# Patient Record
Sex: Male | Born: 1955 | Race: White | Hispanic: No | Marital: Married | Smoking: Former smoker
Health system: Southern US, Community
[De-identification: ages and names within clinical notes are randomized; demographics above are authoritative.]

## PROBLEM LIST (undated history)

## (undated) DIAGNOSIS — G4730 Sleep apnea, unspecified: Secondary | ICD-10-CM

## (undated) DIAGNOSIS — Z87442 Personal history of urinary calculi: Secondary | ICD-10-CM

## (undated) DIAGNOSIS — R7303 Prediabetes: Secondary | ICD-10-CM

## (undated) DIAGNOSIS — I10 Essential (primary) hypertension: Secondary | ICD-10-CM

## (undated) DIAGNOSIS — M1990 Unspecified osteoarthritis, unspecified site: Secondary | ICD-10-CM

## (undated) HISTORY — PX: ELBOW SURGERY: SHX618

## (undated) HISTORY — PX: MENISCUS REPAIR: SHX5179

## (undated) HISTORY — PX: ROTATOR CUFF REPAIR: SHX139

## (undated) HISTORY — PX: NASAL SEPTUM SURGERY: SHX37

---

## 2007-08-24 ENCOUNTER — Encounter: Admission: RE | Admit: 2007-08-24 | Discharge: 2007-08-24 | Payer: Self-pay | Admitting: Geriatric Medicine

## 2007-08-25 ENCOUNTER — Encounter: Admission: RE | Admit: 2007-08-25 | Discharge: 2007-08-25 | Payer: Self-pay | Admitting: Geriatric Medicine

## 2007-08-30 ENCOUNTER — Encounter: Admission: RE | Admit: 2007-08-30 | Discharge: 2007-08-30 | Payer: Self-pay | Admitting: Geriatric Medicine

## 2008-09-21 IMAGING — CR DG ORBITS FOR FOREIGN BODY
3 series · 3 of 3 positions shown · non-contrast
Comparison: None

CLINICAL DATA: Maalox pressure, MRI clearance

ORBITS FOR FOREIGN BODY - 2 VIEW

[view not recorded (1 of 3)]
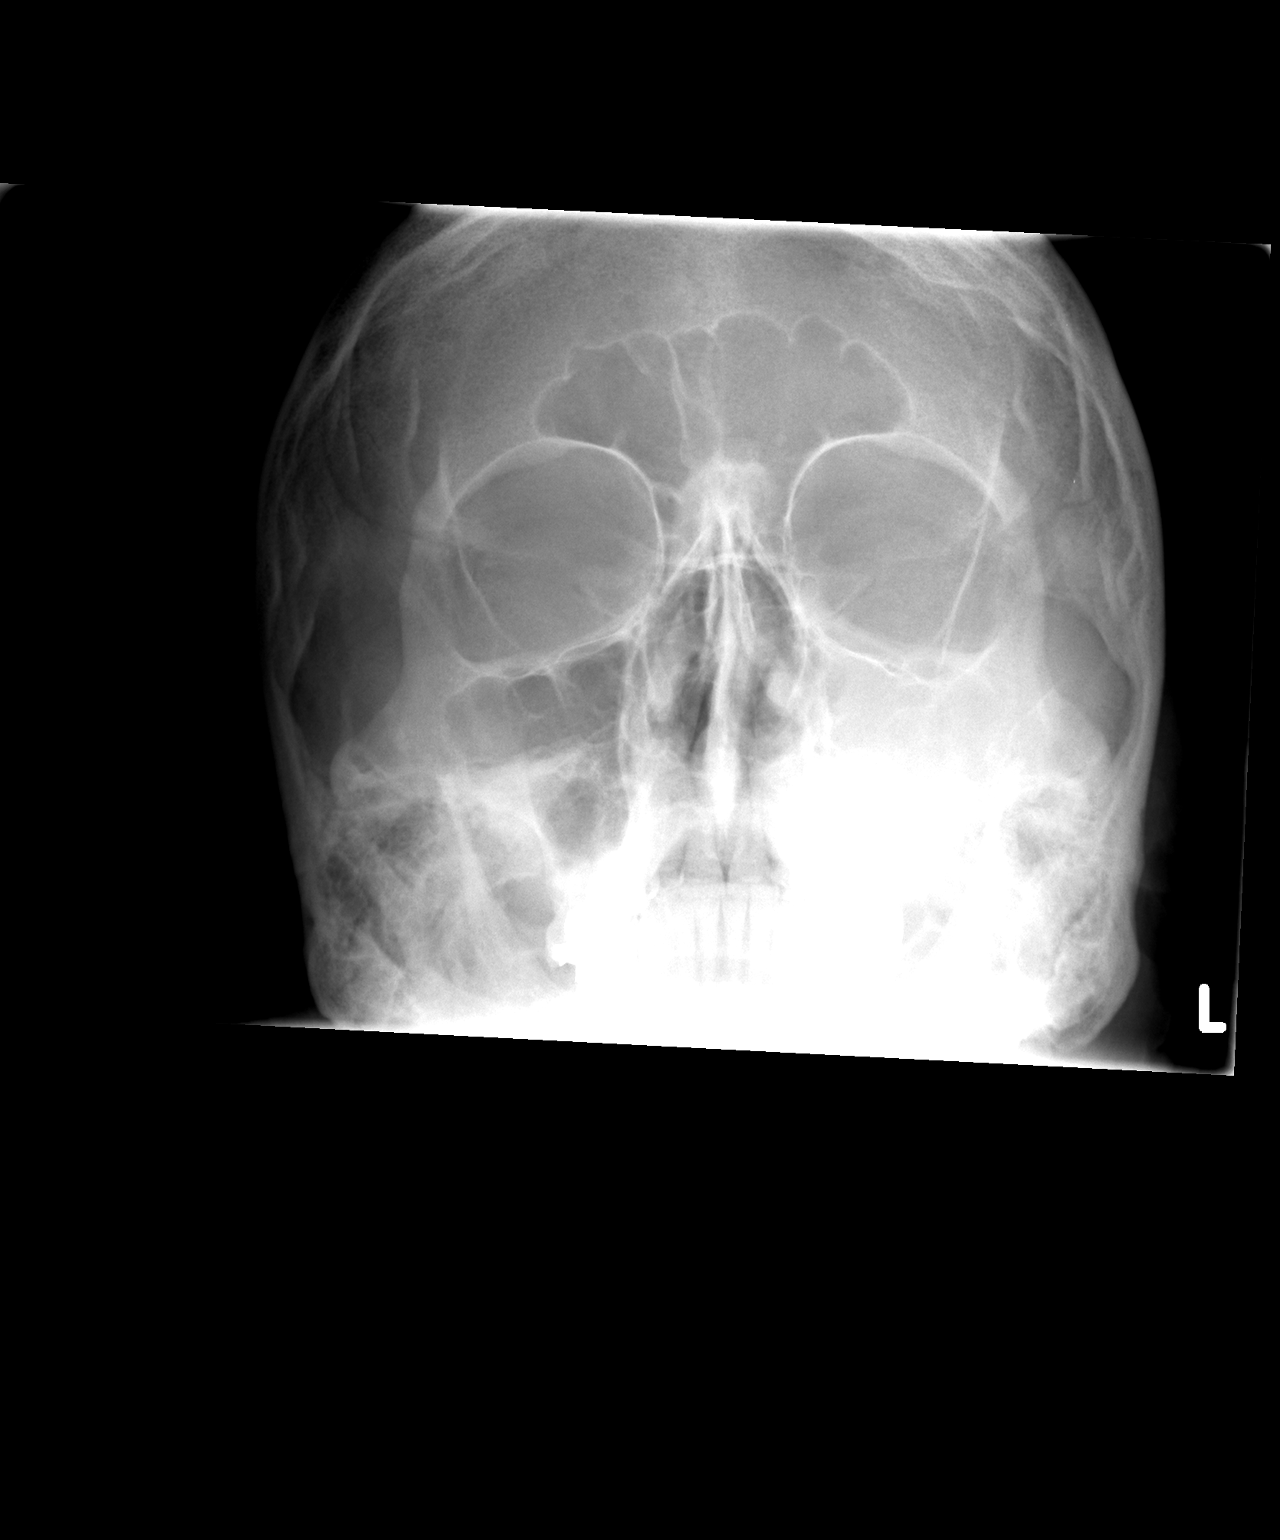

[view not recorded (2 of 3)]
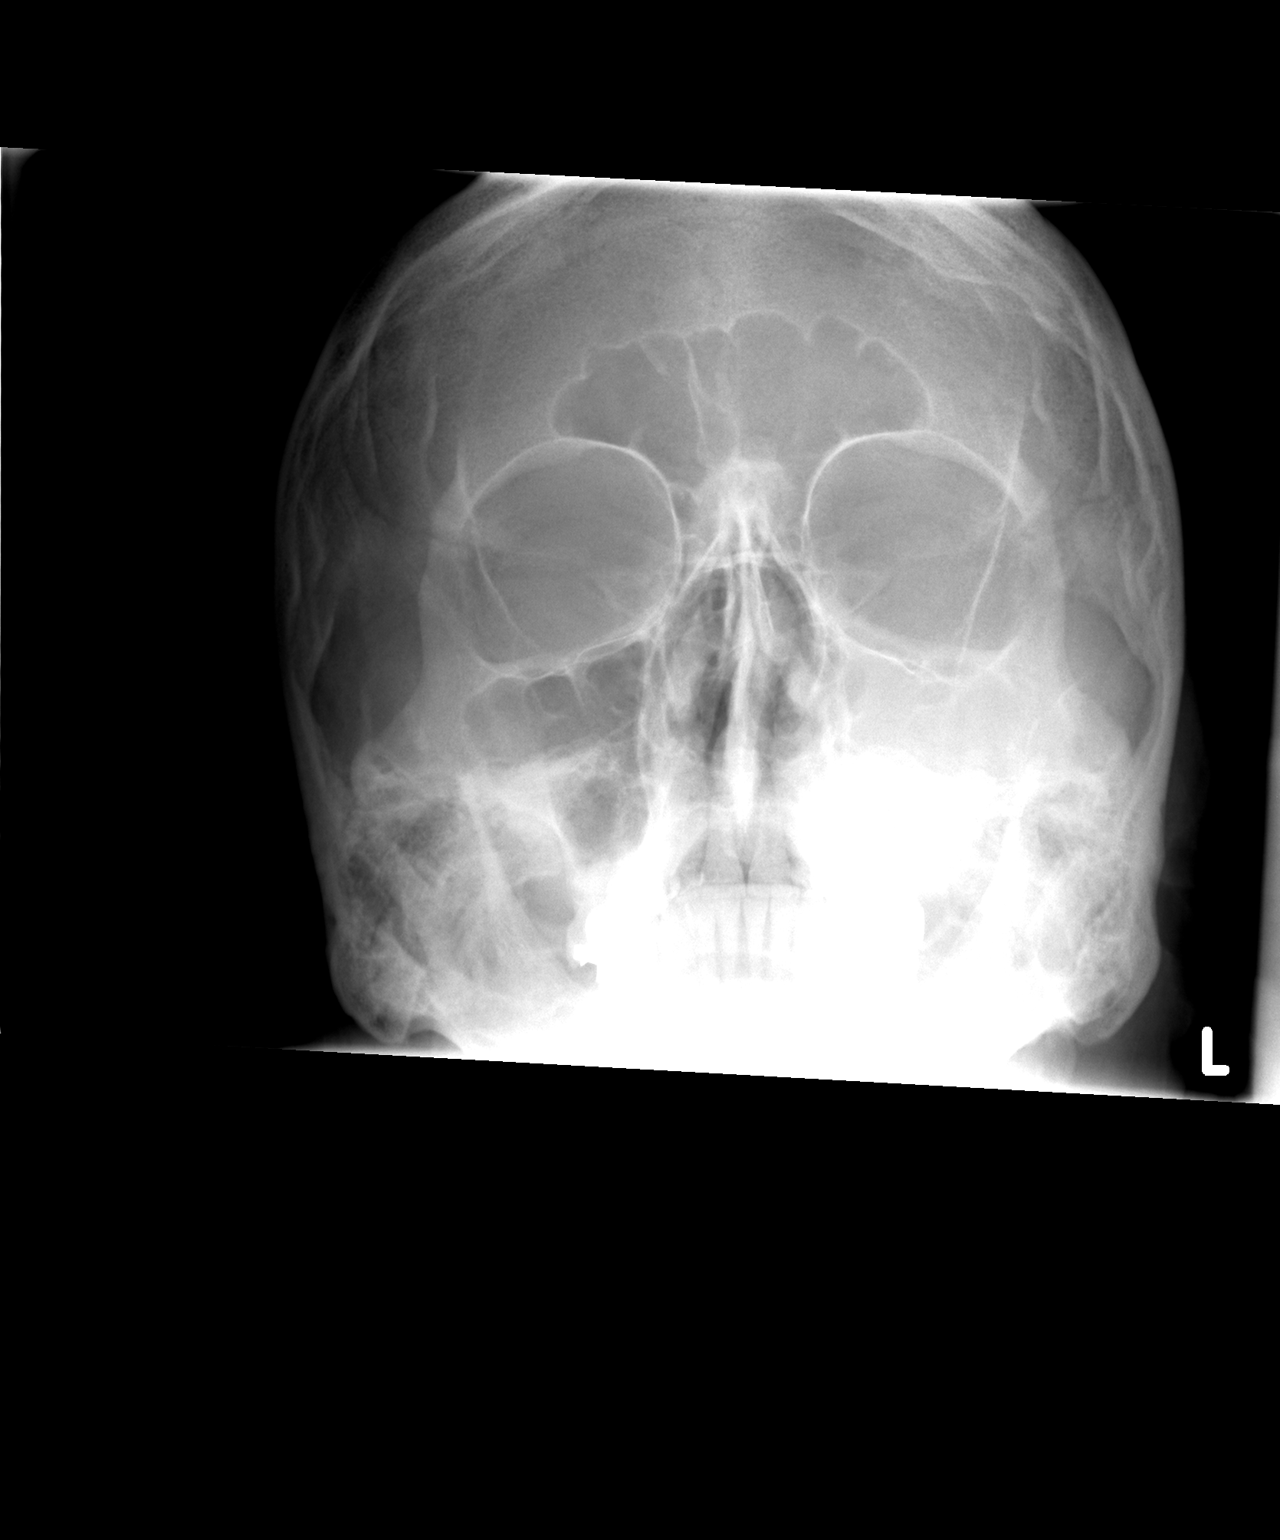

[view not recorded (3 of 3)]
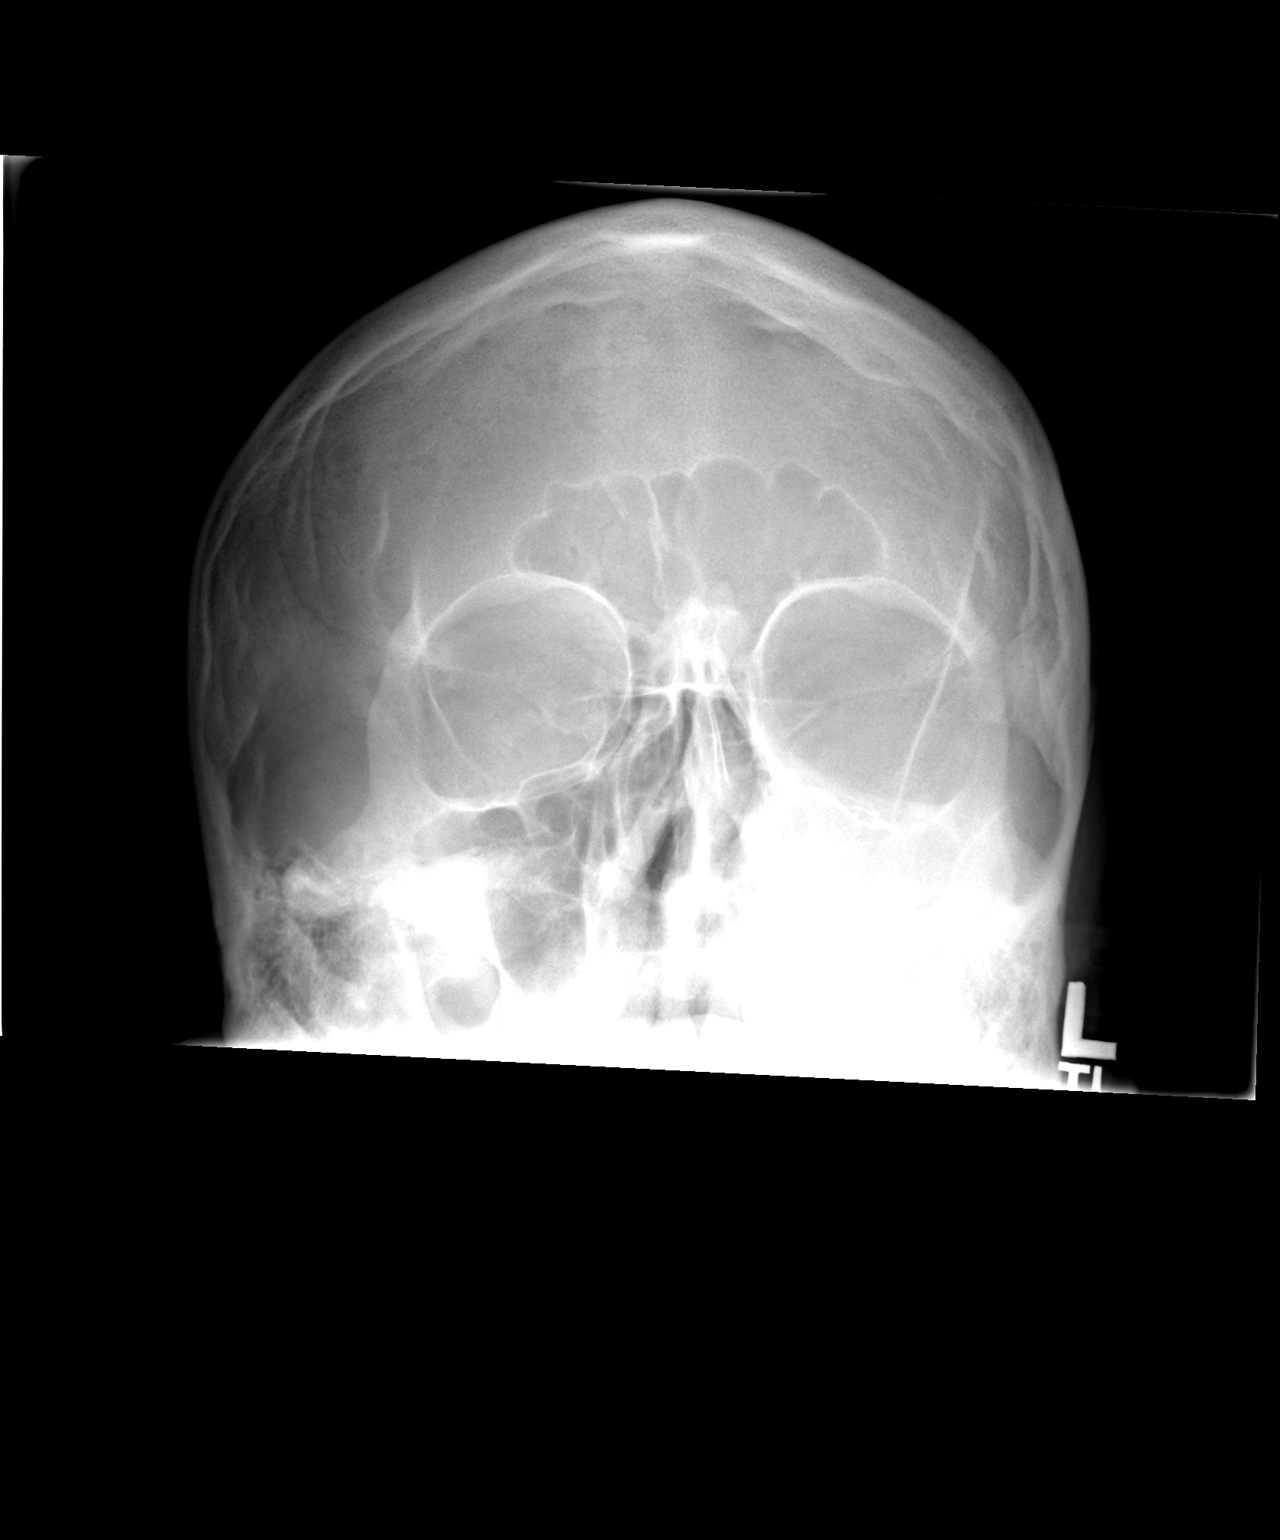

[3 of 3 positions shown; findings below may reference images not displayed]

FINDINGS: No radiodense foreign bodies within the orbits.
IMPRESSION: 1.  No radiodense foreign bodies.  The patient is cleared for MRI.

## 2009-07-12 ENCOUNTER — Ambulatory Visit (HOSPITAL_BASED_OUTPATIENT_CLINIC_OR_DEPARTMENT_OTHER): Admission: RE | Admit: 2009-07-12 | Discharge: 2009-07-12 | Payer: Self-pay | Admitting: Orthopedic Surgery

## 2017-12-18 ENCOUNTER — Ambulatory Visit (INDEPENDENT_AMBULATORY_CARE_PROVIDER_SITE_OTHER): Payer: Self-pay | Admitting: Orthopaedic Surgery

## 2017-12-24 ENCOUNTER — Ambulatory Visit (INDEPENDENT_AMBULATORY_CARE_PROVIDER_SITE_OTHER): Payer: Self-pay | Admitting: Orthopaedic Surgery

## 2017-12-29 ENCOUNTER — Ambulatory Visit (INDEPENDENT_AMBULATORY_CARE_PROVIDER_SITE_OTHER): Payer: No Typology Code available for payment source

## 2017-12-29 ENCOUNTER — Ambulatory Visit (INDEPENDENT_AMBULATORY_CARE_PROVIDER_SITE_OTHER): Payer: No Typology Code available for payment source | Admitting: Orthopaedic Surgery

## 2017-12-29 ENCOUNTER — Encounter (INDEPENDENT_AMBULATORY_CARE_PROVIDER_SITE_OTHER): Payer: Self-pay | Admitting: Orthopaedic Surgery

## 2017-12-29 DIAGNOSIS — G8929 Other chronic pain: Secondary | ICD-10-CM

## 2017-12-29 DIAGNOSIS — M25561 Pain in right knee: Secondary | ICD-10-CM

## 2017-12-29 DIAGNOSIS — M1711 Unilateral primary osteoarthritis, right knee: Secondary | ICD-10-CM | POA: Diagnosis not present

## 2017-12-29 NOTE — Progress Notes (Signed)
Office Visit Note   Patient: Jeffrey Brady           Date of Birth: 1956-01-06           MRN: [id number] Visit Date: 12/29/2017              Requested by: No referring provider defined for this encounter. PCP: Crist Fat, MD   Assessment & Plan: Visit Diagnoses:  1. Chronic pain of right knee   2. Unilateral primary osteoarthritis, right knee     Plan: Given the extent of the cartilage loss in the medial compartment of his knee I have explained to him that an arthroscopic intervention would only temporize things and likely have only about a 30 to 40% chance of working at all.  He has a wide area of large full-thickness cartilage loss on the medial compartment knee with subchondral reactive edema.  At this point I would really recommend a knee replacement based on the severity of his arthritis and the pain that he is having.  I showed him a knee model explained in detail what the surgery involves.  We had a long thorough discussion about his intraoperative and postoperative course and the risk and benefits of this as well.  He is going to talk to our surgery scheduler today because he and his wife feel this is in his best interest as to why based on how this is detrimentally affecting his activities daily living, his quality life, his mobility.  Is been getting worse for over 7 months now in spite of all conservative treatment efforts being tried and failed include even quad strengthening exercises.  Follow-Up Instructions: Return for 2 weeks post-op.   Orders:  Orders Placed This Encounter  Procedures  . XR Knee 1-2 Views Right   No orders of the defined types were placed in this encounter.     Procedures: No procedures performed   Clinical Data: No additional findings.   Subjective: Chief Complaint  Patient presents with  . Right Knee - Pain  Patient is here today for evaluation treatment of right knee pain.  He is actually seen another orthopedic surgeon in his  hometown in [place].  He understands that he has significant cartilage loss in the medial aspect of his knee with a small meniscal tear he also understands that there is a low likelihood that arthroscopic surgery could help this.  His pain is become 10 out of 10 and is daily.  It hurts mainly in the medial aspect of his right knee but some laterally.  He feels like the knee is unstable to him as well.  He has an MRI that accompanies him of his right knee.  He is tried a steroid injection in his knee and is work on activity modification.  He takes anti-inflammatories.  The pain is severe at this point it wakes him up at night and it is detrimentally affect is active daily living, his mobility, and his quality of life.  HPI  Review of Systems He denies any headache, chest pain, shortness of breath, fever, chills, nausea, vomiting.  Objective: Vital Signs: There were no vitals taken for this visit.  Physical Exam He is alert and oriented x3 and in no acute distress Ortho Exam Examination his right knee shows slight varus malalignment.  There is significant medial lateral joint line tenderness with patellofemoral crepitation and painful range of motion.  There is a mild to moderate effusion. Specialty Comments:  No specialty  comments available.  Imaging: No results found. The MRI of his right knee is reviewed.  He has significant medial joint space narrowing.  There is a wide area of full-thickness cartilage loss on the medial femoral condyle and the medial tibial plateau with reactive subchondral edema.  There is mild to moderate thinning laterally.  There is a small posterior horn meniscal root tear which is very small and is radial.  PMFS History: Patient Active Problem List   Diagnosis Date Noted  . Unilateral primary osteoarthritis, right knee 12/29/2017   History reviewed. No pertinent past medical history.  History reviewed. No pertinent family history.  History  reviewed. No pertinent surgical history. Social History   Occupational History  . Not on file  Tobacco Use  . Smoking status: Not on file  Substance and Sexual Activity  . Alcohol use: Not on file  . Drug use: Not on file  . Sexual activity: Not on file

## 2018-01-06 ENCOUNTER — Other Ambulatory Visit (INDEPENDENT_AMBULATORY_CARE_PROVIDER_SITE_OTHER): Payer: Self-pay

## 2018-01-06 ENCOUNTER — Telehealth (INDEPENDENT_AMBULATORY_CARE_PROVIDER_SITE_OTHER): Payer: Self-pay | Admitting: Orthopaedic Surgery

## 2018-01-06 MED ORDER — ACETAMINOPHEN-CODEINE #3 300-30 MG PO TABS: 1.0000 | ORAL_TABLET | Freq: Three times a day (TID) | ORAL | 0 refills | Status: DC | PRN

## 2018-01-06 NOTE — Telephone Encounter (Signed)
Please advise 

## 2018-01-06 NOTE — Telephone Encounter (Signed)
Called into pharmacy, patient aware 

## 2018-01-06 NOTE — Telephone Encounter (Signed)
Preoperatively we can only put her on tramadol or Tylenol 3.  Find out which she would like and then call in 1-2 every 8 hours as needed for pain #40 with no refills.  Also we do not have the ability to move her surgery up from what I understand due to a busy surgery schedule.

## 2018-01-06 NOTE — Telephone Encounter (Signed)
Patients wife Olegario Messier states patient sch'd for surgery 01/20/18, Olegario Messier also states patient's pain has increased and wanted to know if patient could have something for pain. Patient wanted to see if surgery could be moved up also. Kathy's call back # [phone]

## 2018-01-09 ENCOUNTER — Other Ambulatory Visit (INDEPENDENT_AMBULATORY_CARE_PROVIDER_SITE_OTHER): Payer: Self-pay | Admitting: Physician Assistant

## 2018-01-12 NOTE — Pre-Procedure Instructions (Signed)
Jeffrey Brady  01/12/2018      [id number] - [place], [place] - [address] [facility] [place] [address] Phone: [phone] Fax: [phone]    Your procedure is scheduled on 01/20/2018.  Report to [facility] Admitting at 1:20 P.M.  Call this number if you have problems the morning of surgery:  [phone]   Remember:  Do not eat or drink after midnight.     Take these medicines the morning of surgery with A SIP OF WATER: Acetaminophen (Tylenol) - if needed Hydrocodone-acetaminophen (Norco) - if needed  7 days prior to surgery STOP taking any Aspirin (unless otherwise instructed by your surgeon), Aleve, Naproxen, Ibuprofen, Motrin, Advil, Goody's, BC's, all herbal medications, fish oil, and all vitamins     Do not wear jewelry.  Do not wear lotions, powders, or colognes, or deodorant.  Men may shave face and neck.  Do not bring valuables to the hospital.  [facility] is not responsible for any belongings or valuables.  Contacts, eyeglasses, hearing aids, dentures or bridgework may not be worn into surgery.  Leave your suitcase in the car.  After surgery it may be brought to your room.  For patients admitted to the hospital, discharge time will be determined by your treatment team.  Patients discharged the day of surgery will not be allowed to drive home.   Name and phone number of your driver:    Special instructions:   [facility]- Preparing For Surgery  Before surgery, you can play an important role. Because skin is not sterile, your skin needs to be as free of germs as possible. You can reduce the number of germs on your skin by washing with CHG (chlorahexidine gluconate) Soap before surgery.  CHG is an antiseptic cleaner which kills germs and bonds with the skin to continue killing germs even after washing.    Oral Hygiene is also important to reduce your risk of infection.  Remember - BRUSH YOUR TEETH THE MORNING OF SURGERY  WITH YOUR REGULAR TOOTHPASTE  Please do not use if you have an allergy to CHG or antibacterial soaps. If your skin becomes reddened/irritated stop using the CHG.  Do not shave (including legs and underarms) for at least 48 hours prior to first CHG shower. It is OK to shave your face.  Please follow these instructions carefully.   1. Shower the NIGHT BEFORE SURGERY and the MORNING OF SURGERY with CHG.   2. If you chose to wash your hair, wash your hair first as usual with your normal shampoo.  3. After you shampoo, rinse your hair and body thoroughly to remove the shampoo.  4. Use CHG as you would any other liquid soap. You can apply CHG directly to the skin and wash gently with a scrungie or a clean washcloth.   5. Apply the CHG Soap to your body ONLY FROM THE NECK DOWN.  Do not use on open wounds or open sores. Avoid contact with your eyes, ears, mouth and genitals (private parts). Wash Face and genitals (private parts)  with your normal soap.  6. Wash thoroughly, paying special attention to the area where your surgery will be performed.  7. Thoroughly rinse your body with warm water from the neck down.  8. DO NOT shower/wash with your normal soap after using and rinsing off the CHG Soap.  9. Pat yourself dry with a CLEAN TOWEL.  10. Wear CLEAN PAJAMAS to bed the night  before surgery, wear comfortable clothes the morning of surgery  11. Place CLEAN SHEETS on your bed the night of your first shower and DO NOT SLEEP WITH PETS.    Day of Surgery: Shower as stated above. Do not apply any deodorants/lotions.  Please wear clean clothes to the hospital/surgery center.   Remember to brush your teeth WITH YOUR REGULAR TOOTHPASTE.    Please read over the following fact sheets that you were given.

## 2018-01-13 ENCOUNTER — Encounter (HOSPITAL_COMMUNITY): Payer: Self-pay

## 2018-01-13 ENCOUNTER — Encounter (HOSPITAL_COMMUNITY)
Admission: RE | Admit: 2018-01-13 | Discharge: 2018-01-13 | Disposition: A | Discharge status: Home / Self Care | Payer: No Typology Code available for payment source | Source: Ambulatory Visit | Attending: Orthopaedic Surgery | Admitting: Orthopaedic Surgery

## 2018-01-13 ENCOUNTER — Other Ambulatory Visit: Payer: Self-pay

## 2018-01-13 DIAGNOSIS — I491 Atrial premature depolarization: Secondary | ICD-10-CM | POA: Diagnosis not present

## 2018-01-13 DIAGNOSIS — I4510 Unspecified right bundle-branch block: Secondary | ICD-10-CM | POA: Diagnosis not present

## 2018-01-13 DIAGNOSIS — Z01818 Encounter for other preprocedural examination: Secondary | ICD-10-CM | POA: Diagnosis present

## 2018-01-13 DIAGNOSIS — M1711 Unilateral primary osteoarthritis, right knee: Secondary | ICD-10-CM | POA: Insufficient documentation

## 2018-01-13 DIAGNOSIS — I119 Hypertensive heart disease without heart failure: Secondary | ICD-10-CM | POA: Diagnosis not present

## 2018-01-13 HISTORY — DX: Essential (primary) hypertension: I10

## 2018-01-13 HISTORY — DX: Sleep apnea, unspecified: G47.30

## 2018-01-13 LAB — CBC
HEMATOCRIT: 49.3 % (ref 39.0–52.0)
Hemoglobin: 16.5 g/dL (ref 13.0–17.0)
MCH: 30.1 pg (ref 26.0–34.0)
MCHC: 33.5 g/dL (ref 30.0–36.0)
MCV: 90 fL (ref 80.0–100.0)
NRBC: 0 % (ref 0.0–0.2)
PLATELETS: 193 10*3/uL (ref 150–400)
RBC: 5.48 MIL/uL (ref 4.22–5.81)
RDW: 12.7 % (ref 11.5–15.5)
WBC: 7.7 10*3/uL (ref 4.0–10.5)

## 2018-01-13 LAB — SURGICAL PCR SCREEN
MRSA, PCR: NEGATIVE
Staphylococcus aureus: NEGATIVE

## 2018-01-13 LAB — BASIC METABOLIC PANEL
ANION GAP: 13 (ref 5–15)
BUN: 13 mg/dL (ref 8–23)
CO2: 23 mmol/L (ref 22–32)
Calcium: 10.1 mg/dL (ref 8.9–10.3)
Chloride: 103 mmol/L (ref 98–111)
Creatinine, Ser: 0.78 mg/dL (ref 0.61–1.24)
GFR calc non Af Amer: >60 mL/min (ref >60)
Glucose, Bld: 94 mg/dL (ref 70–99)
Potassium: 4.4 mmol/L (ref 3.5–5.1)
Sodium: 139 mmol/L (ref 135–145)

## 2018-01-13 NOTE — Progress Notes (Signed)
PCP - Dr. Eliezer Champagne Cardiologist - patient denies  Chest x-ray - n/a EKG - 01/13/2018 Stress Test - patient denies ECHO - patient denies Cardiac Cath - patient denies  Sleep Study - patient states it was done in [place] over 30 years ago CPAP - yes, unsure of settings  Anesthesia review: yes, abnormal EKG  Patient denies shortness of breath, fever, cough and chest pain at PAT appointment   Patient verbalized understanding of instructions that were given to them at the PAT appointment. Patient was also instructed that they will need to review over the PAT instructions again at home before surgery.

## 2018-01-13 NOTE — Progress Notes (Addendum)
Anesthesia Chart Review:  Case:  [id number] Date/Time:  01/20/18 1504   Procedure:  RIGHT TOTAL KNEE ARTHROPLASTY (Right Knee)   Anesthesia type:  Choice   Pre-op diagnosis:  osteoarthritis right knee   Location:  [facility] ROOM 07 / [facility] OR   Surgeon:  Kathryne Hitch, MD      DISCUSSION: Patient is a 62 year old male scheduled for the above procedure. History includes former smoker, HTN, OSA. BMI is consistent with obesity.  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 He denied chest pain, SOB, cough, fever at PAT. He denied having a cardiologist or previous stress, echo, or cardiac cath. He inquired about NPO status given surgery is not until after 3:00 PM. Reviewed with anesthesiologist Achille Rich, MD. If okay with Dr. Magnus Ivan could consider allowing patient to have clear liquids or light/non-fatty breakfast up to 7:00 AM on the morning of surgery. If there is a chance that case could move up earlier then Dr. Magnus Ivan may still want to keep patient NPO after midnight. I've left a voice message with Sherrie to clarify if Dr. Magnus Ivan has a preference so I can follow-up with patient. EKG also reviewed with Dr. Chaney Malling, and if without CV symptoms then would anticipate that he can proceed as planned. I will confirm if any symptoms once I follow-up with patient regarding NPO status.   ADDENDUM 01/14/18 4:31 PM:  Per Charlton Amor, Dr. Magnus Ivan requests patients stay NPO after MN to avoid any delay in the event of scheduling changes. Jeffrey Brady was notified. He reports that he uses Auto-PAP for OSA. He denied chest pain, SOB, syncope, pedal edema. No known CAD, CHF, or arrhythmias. No prior cardiac studies other than EKGs. For the past ~ four weeks his walking is very limited due to knee pain and swelling. Prior to this he was walking his two dogs about 5 times per day, doing about 12-15K steps/day. Reports he was able to walk up stairs and at an incline without CV symptoms. If no  acute changes then I anticipate that he can proceed as planned.     VS: BP (!) 162/72 (BP Location: Right Arm)   Pulse 77   Temp 36.8 C (Oral)   Resp 18   Ht 6\' 3"  (1.905 m)   Wt 129 kg   SpO2 96%   BMI 35.54 kg/m   PROVIDERS: Crist Fat, MD is PCP [facility]).   LABS: Labs reviewed: Acceptable for surgery. (all labs ordered are listed, but only abnormal results are displayed)  Labs Reviewed  SURGICAL PCR SCREEN  BASIC METABOLIC PANEL  CBC    EKG: 01/13/18: SR with PACs. LAD. RIGHT BBB. LVH.    CV: N/A  Past Medical History:  Diagnosis Date  . Hypertension   . Sleep apnea    sleep study 30 years ago in [place]    Past Surgical History:  Procedure Laterality Date  . ELBOW SURGERY Right   . MENISCUS REPAIR    . NASAL SEPTUM SURGERY    . ROTATOR CUFF REPAIR Right     MEDICATIONS: . acetaminophen (TYLENOL) 500 MG tablet  . acetaminophen-codeine (TYLENOL #3) 300-30 MG tablet  . HYDROcodone-acetaminophen (NORCO/VICODIN) 5-325 MG tablet  . ibuprofen (ADVIL,MOTRIN) 200 MG tablet  . Krill Oil 500 MG CAPS  . lisinopril (PRINIVIL,ZESTRIL) 5 MG tablet  . Multiple Vitamin (ONE-A-DAY MENS PO)  . OVER THE COUNTER MEDICATION   No current facility-administered medications for this encounter.  CBD oil 6 drops BID   Velna [facility] Short Stay Center/Anesthesiology Phone [phone] 01/13/2018 3:30 PM

## 2018-01-19 MED ORDER — TRANEXAMIC ACID-NACL 1000-0.7 MG/100ML-% IV SOLN
1000.0000 mg | INTRAVENOUS | Status: AC
  Administered 2018-01-20: 1000 mg via INTRAVENOUS
  Filled 2018-01-19: qty 100

## 2018-01-19 MED ORDER — DEXTROSE 5 % IV SOLN
3.0000 g | INTRAVENOUS | Status: AC
  Administered 2018-01-20: 3 g via INTRAVENOUS
  Filled 2018-01-19: qty 3

## 2018-01-20 ENCOUNTER — Encounter (HOSPITAL_COMMUNITY)
Admission: RE | Disposition: A | Discharge status: Home Health | Payer: Self-pay | Source: Ambulatory Visit | Attending: Orthopaedic Surgery

## 2018-01-20 ENCOUNTER — Encounter (HOSPITAL_COMMUNITY): Payer: Self-pay | Admitting: *Deleted

## 2018-01-20 ENCOUNTER — Inpatient Hospital Stay (HOSPITAL_COMMUNITY): Payer: No Typology Code available for payment source | Admitting: Anesthesiology

## 2018-01-20 ENCOUNTER — Inpatient Hospital Stay (HOSPITAL_COMMUNITY): Payer: No Typology Code available for payment source

## 2018-01-20 ENCOUNTER — Inpatient Hospital Stay (HOSPITAL_COMMUNITY): Payer: No Typology Code available for payment source | Admitting: Vascular Surgery

## 2018-01-20 ENCOUNTER — Inpatient Hospital Stay (HOSPITAL_COMMUNITY)
Admission: RE | Admit: 2018-01-20 | Discharge: 2018-01-22 | DRG: 470 | Disposition: A | Discharge status: Home Health | Payer: No Typology Code available for payment source | Source: Ambulatory Visit | Attending: Orthopaedic Surgery | Admitting: Orthopaedic Surgery

## 2018-01-20 DIAGNOSIS — Z79899 Other long term (current) drug therapy: Secondary | ICD-10-CM

## 2018-01-20 DIAGNOSIS — Z96651 Presence of right artificial knee joint: Secondary | ICD-10-CM

## 2018-01-20 DIAGNOSIS — I10 Essential (primary) hypertension: Secondary | ICD-10-CM | POA: Diagnosis present

## 2018-01-20 DIAGNOSIS — G4730 Sleep apnea, unspecified: Secondary | ICD-10-CM | POA: Diagnosis present

## 2018-01-20 DIAGNOSIS — M1711 Unilateral primary osteoarthritis, right knee: Secondary | ICD-10-CM | POA: Diagnosis present

## 2018-01-20 DIAGNOSIS — Z87891 Personal history of nicotine dependence: Secondary | ICD-10-CM | POA: Diagnosis not present

## 2018-01-20 HISTORY — PX: TOTAL KNEE ARTHROPLASTY: SHX125

## 2018-01-20 SURGERY — ARTHROPLASTY, KNEE, TOTAL
Anesthesia: Spinal | Site: Knee | Laterality: Right

## 2018-01-20 MED ORDER — LIDOCAINE HCL (CARDIAC) PF 100 MG/5ML IV SOSY
PREFILLED_SYRINGE | INTRAVENOUS | Status: DC | PRN
  Administered 2018-01-20: 80 mg via INTRAVENOUS

## 2018-01-20 MED ORDER — DEXMEDETOMIDINE HCL IN NACL 200 MCG/50ML IV SOLN
INTRAVENOUS | Status: DC | PRN
  Administered 2018-01-20 (×5): 8 ug via INTRAVENOUS

## 2018-01-20 MED ORDER — LISINOPRIL 5 MG PO TABS
5.0000 mg | ORAL_TABLET | Freq: Every day | ORAL | Status: DC
  Administered 2018-01-20 – 2018-01-21 (×2): 5 mg via ORAL
  Filled 2018-01-20 (×2): qty 1

## 2018-01-20 MED ORDER — KETOROLAC TROMETHAMINE 15 MG/ML IJ SOLN
15.0000 mg | Freq: Four times a day (QID) | INTRAMUSCULAR | Status: DC
  Administered 2018-01-20 – 2018-01-22 (×8): 15 mg via INTRAVENOUS
  Filled 2018-01-20 (×7): qty 1

## 2018-01-20 MED ORDER — KETAMINE HCL 50 MG/5ML IJ SOSY
PREFILLED_SYRINGE | INTRAMUSCULAR | Status: AC
  Filled 2018-01-20: qty 5

## 2018-01-20 MED ORDER — PROPOFOL 10 MG/ML IV BOLUS
INTRAVENOUS | Status: AC
  Filled 2018-01-20: qty 20

## 2018-01-20 MED ORDER — 0.9 % SODIUM CHLORIDE (POUR BTL) OPTIME
TOPICAL | Status: DC | PRN
  Administered 2018-01-20: 1000 mL

## 2018-01-20 MED ORDER — DOCUSATE SODIUM 100 MG PO CAPS
100.0000 mg | ORAL_CAPSULE | Freq: Two times a day (BID) | ORAL | Status: DC
  Administered 2018-01-20 – 2018-01-22 (×4): 100 mg via ORAL
  Filled 2018-01-20 (×4): qty 1

## 2018-01-20 MED ORDER — FENTANYL CITRATE (PF) 250 MCG/5ML IJ SOLN
INTRAMUSCULAR | Status: AC
  Filled 2018-01-20: qty 5

## 2018-01-20 MED ORDER — HYDROMORPHONE HCL 1 MG/ML IJ SOLN
INTRAMUSCULAR | Status: AC
  Administered 2018-01-20: 0.5 mg via INTRAVENOUS
  Filled 2018-01-20: qty 1

## 2018-01-20 MED ORDER — HYDROMORPHONE HCL 1 MG/ML IJ SOLN
0.2500 mg | INTRAMUSCULAR | Status: DC | PRN
  Administered 2018-01-20 (×4): 0.5 mg via INTRAVENOUS

## 2018-01-20 MED ORDER — LACTATED RINGERS IV SOLN
INTRAVENOUS | Status: DC
  Administered 2018-01-20 (×3): via INTRAVENOUS

## 2018-01-20 MED ORDER — RIVAROXABAN 10 MG PO TABS
10.0000 mg | ORAL_TABLET | Freq: Every day | ORAL | Status: DC
  Administered 2018-01-21 – 2018-01-22 (×2): 10 mg via ORAL
  Filled 2018-01-20 (×2): qty 1

## 2018-01-20 MED ORDER — OXYCODONE HCL 5 MG PO TABS
ORAL_TABLET | ORAL | Status: AC
  Administered 2018-01-20: 10 mg via ORAL
  Filled 2018-01-20: qty 2

## 2018-01-20 MED ORDER — METHOCARBAMOL 500 MG PO TABS
500.0000 mg | ORAL_TABLET | Freq: Four times a day (QID) | ORAL | Status: DC | PRN
  Administered 2018-01-20 – 2018-01-21 (×3): 500 mg via ORAL
  Filled 2018-01-20 (×3): qty 1

## 2018-01-20 MED ORDER — KETAMINE HCL 10 MG/ML IJ SOLN
INTRAMUSCULAR | Status: DC | PRN
  Administered 2018-01-20: 30 mg via INTRAVENOUS

## 2018-01-20 MED ORDER — METHOCARBAMOL 500 MG PO TABS
ORAL_TABLET | ORAL | Status: AC
  Administered 2018-01-20: 500 mg via ORAL
  Filled 2018-01-20: qty 1

## 2018-01-20 MED ORDER — FENTANYL CITRATE (PF) 250 MCG/5ML IJ SOLN
INTRAMUSCULAR | Status: DC | PRN
  Administered 2018-01-20: 25 ug via INTRAVENOUS
  Administered 2018-01-20: 50 ug via INTRAVENOUS
  Administered 2018-01-20: 25 ug via INTRAVENOUS

## 2018-01-20 MED ORDER — ALUM & MAG HYDROXIDE-SIMETH 200-200-20 MG/5ML PO SUSP
30.0000 mL | ORAL | Status: DC | PRN
  Administered 2018-01-21: 30 mL via ORAL
  Filled 2018-01-20: qty 30

## 2018-01-20 MED ORDER — DEXMEDETOMIDINE HCL IN NACL 200 MCG/50ML IV SOLN
INTRAVENOUS | Status: AC
  Filled 2018-01-20: qty 50

## 2018-01-20 MED ORDER — HYDROMORPHONE HCL 1 MG/ML IJ SOLN
INTRAMUSCULAR | Status: AC
  Administered 2018-01-21: 1 mg via INTRAVENOUS
  Filled 2018-01-20: qty 1

## 2018-01-20 MED ORDER — KETOROLAC TROMETHAMINE 15 MG/ML IJ SOLN
INTRAMUSCULAR | Status: AC
  Filled 2018-01-20: qty 1

## 2018-01-20 MED ORDER — ACETAMINOPHEN 325 MG PO TABS: 325.0000 mg | ORAL_TABLET | Freq: Four times a day (QID) | ORAL | Status: DC | PRN

## 2018-01-20 MED ORDER — LIDOCAINE 2% (20 MG/ML) 5 ML SYRINGE
INTRAMUSCULAR | Status: AC
  Filled 2018-01-20: qty 5

## 2018-01-20 MED ORDER — METOCLOPRAMIDE HCL 5 MG/ML IJ SOLN
5.0000 mg | Freq: Three times a day (TID) | INTRAMUSCULAR | Status: DC | PRN
  Administered 2018-01-21: 5 mg via INTRAVENOUS
  Filled 2018-01-20: qty 2

## 2018-01-20 MED ORDER — GABAPENTIN 100 MG PO CAPS
100.0000 mg | ORAL_CAPSULE | Freq: Three times a day (TID) | ORAL | Status: DC
  Administered 2018-01-20 – 2018-01-22 (×5): 100 mg via ORAL
  Filled 2018-01-20 (×5): qty 1

## 2018-01-20 MED ORDER — MIDAZOLAM HCL 2 MG/2ML IJ SOLN
INTRAMUSCULAR | Status: AC
  Filled 2018-01-20: qty 2

## 2018-01-20 MED ORDER — PROPOFOL 500 MG/50ML IV EMUL
INTRAVENOUS | Status: DC | PRN
  Administered 2018-01-20: 60 ug/kg/min via INTRAVENOUS
  Administered 2018-01-20: 50 ug/kg/min via INTRAVENOUS

## 2018-01-20 MED ORDER — PHENYLEPHRINE 40 MCG/ML (10ML) SYRINGE FOR IV PUSH (FOR BLOOD PRESSURE SUPPORT)
PREFILLED_SYRINGE | INTRAVENOUS | Status: DC | PRN
  Administered 2018-01-20 (×3): 40 ug via INTRAVENOUS
  Administered 2018-01-20: 80 ug via INTRAVENOUS
  Administered 2018-01-20: 40 ug via INTRAVENOUS

## 2018-01-20 MED ORDER — MENTHOL 3 MG MT LOZG: 1.0000 | LOZENGE | OROMUCOSAL | Status: DC | PRN

## 2018-01-20 MED ORDER — FENTANYL CITRATE (PF) 100 MCG/2ML IJ SOLN
100.0000 ug | Freq: Once | INTRAMUSCULAR | Status: AC
  Administered 2018-01-20: 100 ug via INTRAVENOUS

## 2018-01-20 MED ORDER — MIDAZOLAM HCL 2 MG/2ML IJ SOLN
2.0000 mg | Freq: Once | INTRAMUSCULAR | Status: AC
  Administered 2018-01-20: 2 mg via INTRAVENOUS

## 2018-01-20 MED ORDER — DIPHENHYDRAMINE HCL 12.5 MG/5ML PO ELIX: 12.5000 mg | ORAL_SOLUTION | ORAL | Status: DC | PRN

## 2018-01-20 MED ORDER — SODIUM CHLORIDE 0.9 % IV SOLN
INTRAVENOUS | Status: DC
  Administered 2018-01-20: 21:00:00 via INTRAVENOUS

## 2018-01-20 MED ORDER — PROPOFOL 10 MG/ML IV BOLUS
INTRAVENOUS | Status: DC | PRN
  Administered 2018-01-20 (×3): 20 mg via INTRAVENOUS

## 2018-01-20 MED ORDER — FENTANYL CITRATE (PF) 100 MCG/2ML IJ SOLN
INTRAMUSCULAR | Status: AC
  Filled 2018-01-20: qty 2

## 2018-01-20 MED ORDER — HYDROMORPHONE HCL 1 MG/ML IJ SOLN
1.0000 mg | INTRAMUSCULAR | Status: DC | PRN
  Administered 2018-01-20 – 2018-01-22 (×3): 1 mg via INTRAVENOUS
  Filled 2018-01-20 (×2): qty 1

## 2018-01-20 MED ORDER — SODIUM CHLORIDE 0.9 % IR SOLN
Status: DC | PRN
  Administered 2018-01-20: 3000 mL

## 2018-01-20 MED ORDER — PHENOL 1.4 % MT LIQD: 1.0000 | OROMUCOSAL | Status: DC | PRN

## 2018-01-20 MED ORDER — METHOCARBAMOL 1000 MG/10ML IJ SOLN
500.0000 mg | Freq: Four times a day (QID) | INTRAVENOUS | Status: DC | PRN
  Filled 2018-01-20: qty 5

## 2018-01-20 MED ORDER — DEXAMETHASONE SODIUM PHOSPHATE 10 MG/ML IJ SOLN
INTRAMUSCULAR | Status: DC | PRN
  Administered 2018-01-20: 10 mg via INTRAVENOUS

## 2018-01-20 MED ORDER — POLYETHYLENE GLYCOL 3350 17 G PO PACK: 17.0000 g | PACK | Freq: Every day | ORAL | Status: DC | PRN

## 2018-01-20 MED ORDER — ONDANSETRON HCL 4 MG/2ML IJ SOLN
INTRAMUSCULAR | Status: DC | PRN
  Administered 2018-01-20: 4 mg via INTRAVENOUS

## 2018-01-20 MED ORDER — METOCLOPRAMIDE HCL 5 MG PO TABS: 5.0000 mg | ORAL_TABLET | Freq: Three times a day (TID) | ORAL | Status: DC | PRN

## 2018-01-20 MED ORDER — CHLORHEXIDINE GLUCONATE 4 % EX LIQD: 60.0000 mL | Freq: Once | CUTANEOUS | Status: DC

## 2018-01-20 MED ORDER — MIDAZOLAM HCL 2 MG/2ML IJ SOLN
INTRAMUSCULAR | Status: DC | PRN
  Administered 2018-01-20: 1 mg via INTRAVENOUS

## 2018-01-20 MED ORDER — ONDANSETRON HCL 4 MG PO TABS: 4.0000 mg | ORAL_TABLET | Freq: Four times a day (QID) | ORAL | Status: DC | PRN

## 2018-01-20 MED ORDER — CEFAZOLIN SODIUM-DEXTROSE 2-4 GM/100ML-% IV SOLN
2.0000 g | Freq: Four times a day (QID) | INTRAVENOUS | Status: AC
  Administered 2018-01-20 – 2018-01-21 (×2): 2 g via INTRAVENOUS
  Filled 2018-01-20 (×2): qty 100

## 2018-01-20 MED ORDER — PHENYLEPHRINE 40 MCG/ML (10ML) SYRINGE FOR IV PUSH (FOR BLOOD PRESSURE SUPPORT)
PREFILLED_SYRINGE | INTRAVENOUS | Status: AC
  Filled 2018-01-20: qty 10

## 2018-01-20 MED ORDER — OXYCODONE HCL 5 MG PO TABS
10.0000 mg | ORAL_TABLET | ORAL | Status: DC | PRN
  Administered 2018-01-21: 15 mg via ORAL
  Administered 2018-01-21 (×2): 10 mg via ORAL
  Administered 2018-01-22 (×2): 15 mg via ORAL
  Filled 2018-01-20: qty 2
  Filled 2018-01-20 (×2): qty 3
  Filled 2018-01-20: qty 2
  Filled 2018-01-20: qty 3

## 2018-01-20 MED ORDER — PROPOFOL 1000 MG/100ML IV EMUL
INTRAVENOUS | Status: AC
  Filled 2018-01-20: qty 100

## 2018-01-20 MED ORDER — SODIUM CHLORIDE 0.9 % IV SOLN
INTRAVENOUS | Status: DC | PRN
  Administered 2018-01-20: 25 ug/min via INTRAVENOUS

## 2018-01-20 MED ORDER — BUPIVACAINE IN DEXTROSE 0.75-8.25 % IT SOLN
INTRATHECAL | Status: DC | PRN
  Administered 2018-01-20: 1.6 mL via INTRATHECAL

## 2018-01-20 MED ORDER — OXYCODONE HCL 5 MG PO TABS
5.0000 mg | ORAL_TABLET | ORAL | Status: DC | PRN
  Administered 2018-01-20 – 2018-01-21 (×3): 10 mg via ORAL
  Filled 2018-01-20 (×3): qty 2

## 2018-01-20 MED ORDER — ROPIVACAINE HCL 5 MG/ML IJ SOLN
INTRAMUSCULAR | Status: DC | PRN
  Administered 2018-01-20: 20 mL via PERINEURAL

## 2018-01-20 MED ORDER — ONDANSETRON HCL 4 MG/2ML IJ SOLN: 4.0000 mg | Freq: Four times a day (QID) | INTRAMUSCULAR | Status: DC | PRN

## 2018-01-20 SURGICAL SUPPLY — 63 items
BANDAGE ACE 6X5 VEL STRL LF (GAUZE/BANDAGES/DRESSINGS) ×4 IMPLANT
BANDAGE ESMARK 6X9 LF (GAUZE/BANDAGES/DRESSINGS) ×1 IMPLANT
BASEPLATE TIBIAL PRIMARY SZ6 (Orthopedic Implant) ×2 IMPLANT
BEARING TRIATHLON TIBIAL 6X11 (Orthopedic Implant) ×2 IMPLANT
BLADE SAG 18X100X1.27 (BLADE) ×2 IMPLANT
BNDG ESMARK 6X9 LF (GAUZE/BANDAGES/DRESSINGS) ×2
BOWL SMART MIX CTS (DISPOSABLE) ×2 IMPLANT
CEMENT BONE SIMPLEX SPEEDSET (Cement) ×2 IMPLANT
COVER SURGICAL LIGHT HANDLE (MISCELLANEOUS) ×2 IMPLANT
COVER WAND RF STERILE (DRAPES) ×2 IMPLANT
CUFF TOURNIQUET SINGLE 34IN LL (TOURNIQUET CUFF) ×2 IMPLANT
CUFF TOURNIQUET SINGLE 44IN (TOURNIQUET CUFF) IMPLANT
DRAPE EXTREMITY T 121X128X90 (DRAPE) ×2 IMPLANT
DRAPE HALF SHEET 40X57 (DRAPES) ×2 IMPLANT
DRAPE U-SHAPE 47X51 STRL (DRAPES) ×2 IMPLANT
DRSG PAD ABDOMINAL 8X10 ST (GAUZE/BANDAGES/DRESSINGS) ×2 IMPLANT
DURAPREP 26ML APPLICATOR (WOUND CARE) ×2 IMPLANT
ELECT CAUTERY BLADE 6.4 (BLADE) ×2 IMPLANT
ELECT REM PT RETURN 9FT ADLT (ELECTROSURGICAL) ×2
ELECTRODE REM PT RTRN 9FT ADLT (ELECTROSURGICAL) ×1 IMPLANT
FACESHIELD WRAPAROUND (MASK) ×4 IMPLANT
FEMORAL PEG DISTAL FIXATION (Orthopedic Implant) ×2 IMPLANT
FEMORAL SZ 5 (Orthopedic Implant) ×2 IMPLANT
GAUZE SPONGE 4X4 12PLY STRL (GAUZE/BANDAGES/DRESSINGS) ×2 IMPLANT
GAUZE XEROFORM 1X8 LF (GAUZE/BANDAGES/DRESSINGS) ×2 IMPLANT
GLOVE BIOGEL PI IND STRL 8 (GLOVE) ×2 IMPLANT
GLOVE BIOGEL PI INDICATOR 8 (GLOVE) ×2
GLOVE ORTHO TXT STRL SZ7.5 (GLOVE) ×2 IMPLANT
GLOVE SURG ORTHO 8.0 STRL STRW (GLOVE) ×2 IMPLANT
GOWN STRL REUS W/ TWL LRG LVL3 (GOWN DISPOSABLE) IMPLANT
GOWN STRL REUS W/ TWL XL LVL3 (GOWN DISPOSABLE) ×2 IMPLANT
GOWN STRL REUS W/TWL LRG LVL3 (GOWN DISPOSABLE)
GOWN STRL REUS W/TWL XL LVL3 (GOWN DISPOSABLE) ×2
HANDPIECE INTERPULSE COAX TIP (DISPOSABLE) ×1
IMMOBILIZER KNEE 22 UNIV (SOFTGOODS) ×2 IMPLANT
KIT BASIN OR (CUSTOM PROCEDURE TRAY) ×2 IMPLANT
KIT TURNOVER KIT B (KITS) ×2 IMPLANT
MANIFOLD NEPTUNE II (INSTRUMENTS) ×2 IMPLANT
NDL SAFETY ECLIPSE 18X1.5 (NEEDLE) IMPLANT
NEEDLE HYPO 18GX1.5 SHARP (NEEDLE)
NS IRRIG 1000ML POUR BTL (IV SOLUTION) ×2 IMPLANT
PACK TOTAL JOINT (CUSTOM PROCEDURE TRAY) ×2 IMPLANT
PAD ARMBOARD 7.5X6 YLW CONV (MISCELLANEOUS) ×2 IMPLANT
PADDING CAST COTTON 6X4 STRL (CAST SUPPLIES) ×2 IMPLANT
PATELLA 32MMX10MM (Knees) ×2 IMPLANT
SET HNDPC FAN SPRY TIP SCT (DISPOSABLE) ×1 IMPLANT
SET PAD KNEE POSITIONER (MISCELLANEOUS) ×2 IMPLANT
STAPLER VISISTAT 35W (STAPLE) IMPLANT
STRIP CLOSURE SKIN 1/2X4 (GAUZE/BANDAGES/DRESSINGS) IMPLANT
SUCTION FRAZIER HANDLE 10FR (MISCELLANEOUS) ×1
SUCTION TUBE FRAZIER 10FR DISP (MISCELLANEOUS) ×1 IMPLANT
SUT MNCRL AB 4-0 PS2 18 (SUTURE) IMPLANT
SUT VIC AB 0 CT1 27 (SUTURE) ×1
SUT VIC AB 0 CT1 27XBRD ANBCTR (SUTURE) ×1 IMPLANT
SUT VIC AB 1 CT1 27 (SUTURE) ×2
SUT VIC AB 1 CT1 27XBRD ANBCTR (SUTURE) ×2 IMPLANT
SUT VIC AB 2-0 CT1 27 (SUTURE) ×2
SUT VIC AB 2-0 CT1 TAPERPNT 27 (SUTURE) ×2 IMPLANT
SYR 50ML LL SCALE MARK (SYRINGE) IMPLANT
TOWEL OR 17X24 6PK STRL BLUE (TOWEL DISPOSABLE) ×2 IMPLANT
TOWEL OR 17X26 10 PK STRL BLUE (TOWEL DISPOSABLE) ×2 IMPLANT
TRAY CATH 16FR W/PLASTIC CATH (SET/KITS/TRAYS/PACK) IMPLANT
WRAP KNEE MAXI GEL POST OP (GAUZE/BANDAGES/DRESSINGS) ×2 IMPLANT

## 2018-01-20 NOTE — H&P (Signed)
TOTAL KNEE ADMISSION H&P  Patient is being admitted for right total knee arthroplasty.  Subjective:  Chief Complaint:right knee pain.  HPI: Jeffrey Brady, 62 y.o. male, has a history of pain and functional disability in the right knee due to arthritis and has failed non-surgical conservative treatments for greater than 12 weeks to includeNSAID's and/or analgesics, corticosteriod injections, viscosupplementation injections, flexibility and strengthening excercises, use of assistive devices, weight reduction as appropriate and activity modification.  Onset of symptoms was gradual, starting 3 years ago with gradually worsening course since that time. The patient noted prior procedures on the knee to include  menisectomy on the right knee(s).  Patient currently rates pain in the right knee(s) at 10 out of 10 with activity. Patient has night pain, worsening of pain with activity and weight bearing, pain that interferes with activities of daily living, pain with passive range of motion, crepitus and joint swelling.  Patient has evidence of subchondral sclerosis, periarticular osteophytes and joint space narrowing by imaging studies. There is no active infection.  Patient Active Problem List   Diagnosis Date Noted  . Unilateral primary osteoarthritis, right knee 12/29/2017   Past Medical History:  Diagnosis Date  . Hypertension   . Sleep apnea    sleep study 30 years ago in [place]    Past Surgical History:  Procedure Laterality Date  . ELBOW SURGERY Right   . MENISCUS REPAIR    . NASAL SEPTUM SURGERY    . ROTATOR CUFF REPAIR Right     Current Facility-Administered Medications  Medication Dose Route Frequency Provider Last Rate Last Dose  . ceFAZolin (ANCEF) 3 g in dextrose 5 % 50 mL IVPB  3 g Intravenous To SS-Surg Kathryne Hitch, MD      . tranexamic acid (CYKLOKAPRON) IVPB 1,000 mg  1,000 mg Intravenous To SS-Surg Kathryne Hitch, MD       Current Outpatient Medications   Medication Sig Dispense Refill Last Dose  . acetaminophen (TYLENOL) 500 MG tablet Take 500 mg by mouth every 6 (six) hours as needed for moderate pain or headache.     Marland Kitchen HYDROcodone-acetaminophen (NORCO/VICODIN) 5-325 MG tablet Take 1 tablet by mouth every 8 (eight) hours as needed for moderate pain.     Marland Kitchen ibuprofen (ADVIL,MOTRIN) 200 MG tablet Take 400 mg by mouth every 6 (six) hours as needed for headache or moderate pain.     [name] Oil 500 MG CAPS Take 500 mg by mouth daily.     Marland Kitchen lisinopril (PRINIVIL,ZESTRIL) 5 MG tablet Take 5 mg by mouth daily.  5   . Multiple Vitamin (ONE-A-DAY MENS PO) Take 1 tablet by mouth daily.     Marland Kitchen OVER THE COUNTER MEDICATION Take 6 drops by mouth 2 (two) times daily. CBD Oil     . acetaminophen-codeine (TYLENOL #3) 300-30 MG tablet Take 1-2 tablets by mouth every 8 (eight) hours as needed for moderate pain. (Patient not taking: Reported on 01/08/2018) 40 tablet 0 Not Taking at Unknown time   Allergies  Allergen Reactions  . Nexium [Esomeprazole Magnesium] Nausea And Vomiting and Other (See Comments)    Dehydrated    Social History   Tobacco Use  . Smoking status: Former Smoker    Types: Cigarettes  . Smokeless tobacco: Never Used  Substance Use Topics  . Alcohol use: Yes    Comment: occasional    No family history on file.   Review of Systems  Musculoskeletal: Positive for joint pain.  All other  systems reviewed and are negative.   Objective:  Physical Exam  Constitutional: He is oriented to person, place, and time. He appears well-developed and well-nourished.  HENT:  Head: Normocephalic and atraumatic.  Eyes: Pupils are equal, round, and reactive to light. EOM are normal.  Neck: Normal range of motion. Neck supple.  Cardiovascular: Normal rate and regular rhythm.  Respiratory: Effort normal and breath sounds normal.  GI: Soft. Bowel sounds are normal.  Musculoskeletal:       Right knee: He exhibits decreased range of motion, effusion  and abnormal alignment. Tenderness found. Medial joint line and lateral joint line tenderness noted.  Neurological: He is alert and oriented to person, place, and time.  Skin: Skin is warm and dry.  Psychiatric: He has a normal mood and affect.    Vital signs in last 24 hours:    Labs:   Estimated body mass index is 35.54 kg/m as calculated from the following:   Height as of 01/13/18: 6\' 3"  (1.905 m).   Weight as of 01/13/18: 129 kg.   Imaging Review Plain radiographs demonstrate severe degenerative joint disease of the right knee(s). The overall alignment ismild varus. The bone quality appears to be good for age and reported activity level.   Preoperative templating of the joint replacement has been completed, documented, and submitted to the Operating Room personnel in order to optimize intra-operative equipment management.    Patient's anticipated LOS is less than 2 midnights, meeting these requirements: - Younger than 72 - Lives within 1 hour of care - Has a competent adult at home to recover with post-op recover - NO history of  - Chronic pain requiring opiods  - Diabetes  - Coronary Artery Disease  - Heart failure  - Heart attack  - Stroke  - DVT/VTE  - Cardiac arrhythmia  - Respiratory Failure/COPD  - Renal failure  - Anemia  - Advanced Liver disease        Assessment/Plan:  End stage arthritis, right knee   The patient history, physical examination, clinical judgment of the provider and imaging studies are consistent with end stage degenerative joint disease of the right knee(s) and total knee arthroplasty is deemed medically necessary. The treatment options including medical management, injection therapy arthroscopy and arthroplasty were discussed at length. The risks and benefits of total knee arthroplasty were presented and reviewed. The risks due to aseptic loosening, infection, stiffness, patella tracking problems, thromboembolic complications and  other imponderables were discussed. The patient acknowledged the explanation, agreed to proceed with the plan and consent was signed. Patient is being admitted for inpatient treatment for surgery, pain control, PT, OT, prophylactic antibiotics, VTE prophylaxis, progressive ambulation and ADL's and discharge planning. The patient is planning to be discharged home with home health services

## 2018-01-20 NOTE — Anesthesia Procedure Notes (Signed)
Anesthesia Regional Block: Adductor canal block   Pre-Anesthetic Checklist: ,, timeout performed, Correct Patient, Correct Site, Correct Laterality, Correct Procedure, Correct Position, site marked, Risks and benefits discussed, pre-op evaluation,  At surgeon's request and post-op pain management  Laterality: Right  Prep: Maximum Sterile Barrier Precautions used, chloraprep       Needles:  Injection technique: Single-shot  Needle Type: Echogenic Stimulator Needle     Needle Length: 9cm  Needle Gauge: 21     Additional Needles:   Procedures:,,,, ultrasound used (permanent image in chart),,,,  Narrative:  Start time: 01/20/2018 1:32 PM End time: 01/20/2018 1:42 PM Injection made incrementally with aspirations every 5 mL.  Performed by: Personally  Anesthesiologist: Gaynelle Adu, MD  Additional Notes: 2% Lidocaine skin wheel.

## 2018-01-20 NOTE — Progress Notes (Signed)
Orthopedic Tech Progress Note Patient Details:  Jeffrey Brady 1956-03-25 [id number]  CPM Right Knee CPM Right Knee: On Right Knee Flexion (Degrees): 90 Right Knee Extension (Degrees): 0 Additional Comments: foot roll  Post Interventions Patient Tolerated: Well Instructions Provided: Care of device, Adjustment of device  Saul Fordyce 01/20/2018, 6:39 PM

## 2018-01-20 NOTE — Anesthesia Postprocedure Evaluation (Signed)
Anesthesia Post Note  Patient: Jeffrey Brady  Procedure(s) Performed: RIGHT TOTAL KNEE ARTHROPLASTY (Right Knee)     Patient location during evaluation: PACU Anesthesia Type: Spinal and Regional Level of consciousness: oriented and awake and alert Pain management: pain level controlled Vital Signs Assessment: post-procedure vital signs reviewed and stable Respiratory status: spontaneous breathing and respiratory function stable Cardiovascular status: blood pressure returned to baseline and stable Postop Assessment: no headache, no backache, no apparent nausea or vomiting, spinal receding and patient able to bend at knees Anesthetic complications: no    Last Vitals:  Vitals:   01/20/18 1730 01/20/18 1800  BP: (!) 146/88 (!) 136/98  Pulse: 87 74  Resp: 20 14  Temp:    SpO2: 99% 100%    Last Pain:  Vitals:   01/20/18 1715  TempSrc:   PainSc: 0-No pain                 Tiwanna Tuch,W. EDMOND

## 2018-01-20 NOTE — Anesthesia Preprocedure Evaluation (Addendum)
Anesthesia Evaluation  Patient identified by MRN, date of birth, ID band Patient awake    Reviewed: Allergy & Precautions, H&P , NPO status , Patient's Chart, lab work & pertinent test results  Airway Mallampati: III  TM Distance: >3 FB Neck ROM: Full    Dental no notable dental hx. (+) Teeth Intact, Dental Advisory Given   Pulmonary neg pulmonary ROS, former smoker,    Pulmonary exam normal breath sounds clear to auscultation       Cardiovascular hypertension, Pt. on medications  Rhythm:Regular Rate:Normal     Neuro/Psych negative neurological ROS  negative psych ROS   GI/Hepatic negative GI ROS, Neg liver ROS,   Endo/Other  negative endocrine ROS  Renal/GU negative Renal ROS  negative genitourinary   Musculoskeletal  (+) Arthritis , Osteoarthritis,    Abdominal   Peds  Hematology negative hematology ROS (+)   Anesthesia Other Findings   Reproductive/Obstetrics negative OB ROS                            Anesthesia Physical Anesthesia Plan  ASA: II  Anesthesia Plan: Spinal   Post-op Pain Management:  Regional for Post-op pain   Induction: Intravenous  PONV Risk Score and Plan: 2 and Propofol infusion, Midazolam and Ondansetron  Airway Management Planned: Simple Face Mask  Additional Equipment:   Intra-op Plan:   Post-operative Plan:   Informed Consent: I have reviewed the patients History and Physical, chart, labs and discussed the procedure including the risks, benefits and alternatives for the proposed anesthesia with the patient or authorized representative who has indicated his/her understanding and acceptance.   Dental advisory given  Plan Discussed with: CRNA  Anesthesia Plan Comments:         Anesthesia Quick Evaluation

## 2018-01-20 NOTE — Transfer of Care (Signed)
Immediate Anesthesia Transfer of Care Note  Patient: Jeffrey Brady  Procedure(s) Performed: RIGHT TOTAL KNEE ARTHROPLASTY (Right Knee)  Patient Location: PACU  Anesthesia Type:MAC  Level of Consciousness: awake and patient cooperative  Airway & Oxygen Therapy: Patient Spontanous Breathing  Post-op Assessment: Report given to RN and Post -op Vital signs reviewed and stable  Post vital signs: Reviewed and stable  Last Vitals:  Vitals Value Taken Time  BP    Temp    Pulse 89 01/20/2018  5:14 PM  Resp 43 01/20/2018  5:14 PM  SpO2 97 % 01/20/2018  5:14 PM  Vitals shown include unvalidated device data.  Last Pain:  Vitals:   01/20/18 1303  TempSrc:   PainSc: 0-No pain         Complications: No apparent anesthesia complications

## 2018-01-20 NOTE — Anesthesia Procedure Notes (Signed)
Spinal  Patient location during procedure: OR Start time: 01/20/2018 2:40 PM End time: 01/20/2018 2:50 PM Staffing Anesthesiologist: Leonides Grills, MD Performed: anesthesiologist  Preanesthetic Checklist Completed: patient identified, surgical consent, pre-op evaluation, timeout performed, IV checked, risks and benefits discussed and monitors and equipment checked Spinal Block Patient position: sitting Prep: DuraPrep Patient monitoring: cardiac monitor, continuous pulse ox and blood pressure Approach: midline Location: L4-5 Injection technique: single-shot Needle Needle type: Pencan  Needle gauge: 24 G Needle length: 9 cm Assessment Sensory level: T10 Additional Notes Functioning IV was confirmed and monitors were applied. Sterile prep and drape, including hand hygiene and sterile gloves were used. The patient was positioned and the spine was prepped. The skin was anesthetized with lidocaine.  Free flow of clear CSF was obtained prior to injecting local anesthetic into the CSF.  The spinal needle aspirated freely following injection.  The needle was carefully withdrawn.  The patient tolerated the procedure well.

## 2018-01-20 NOTE — Op Note (Signed)
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  PROCEDURE:  Right total knee arthroplasty.  IMPLANTS:  Stryker Triathlon cemented knee system with size 5 femur, size 6 tibial tray, 11 mm thickness fixed-bearing polyethylene insert, size 32 patellar button.  SURGEON:  Vanita Panda. Magnus Ivan, MD  ASSISTANT:  Richardean Canal, PA-C  ANESTHESIA: 1.  Right lower extremity adductor canal block. 2.  Spinal.  ESTIMATED BLOOD LOSS:  Less than 200 mL.  COMPLICATIONS:  None.  INDICATION:  The patient is a 62 year old gentleman with debilitating arthritis involving his right knee.  This has been seen on plain films and an MRI.  He has tried and failed other forms of conservative treatment.  His pain is daily, and at this point  it has detrimentally affected his activities of daily living, his quality of life, and his mobility.  At this point, total knee arthroplasty has been recommended.  He does wish to proceed.  He understands fully the risk of acute blood loss anemia, nerve  or vessel injury, fracture, infection, DVT and implant failure.  He understands our goals are to decrease pain, improve mobility and overall improve quality of life.  DESCRIPTION OF PROCEDURE:  After informed consent was obtained and appropriate right knee was marked, he was brought to the operating room after an adductor canal block was obtained in the holding room.  This was done of his right lower extremity.  In  the operating room, he was sat up on the operating table.  Spinal anesthesia was obtained.  He was then laid in supine  position.  A Foley catheter was placed, and a nonsterile tourniquet was placed on his upper right thigh.  His right thigh, knee, leg,  ankle, and foot were prepped and draped with DuraPrep and sterile drapes.  A time-out was called, and he was identified as correct patient, correct right knee.  We then used an Esmarch to wrap that leg, and tourniquet was inflated to 300 mm of pressure.   I then made an incision over the patella and carried this proximally and distally.  I dissected down the knee joint and carried out a medial parapatellar arthrotomy.  We found significant arthritis in the knee and a moderate joint effusion.  With the  knee in a flexed position, we removed remnants of ACL, PCL, medial and lateral meniscus.  We set our extramedullary cutting guide for making our tibia cut, correcting for varus, valgus, and a neutral slope and taking 9 mm off the high side.  We made this  cut without difficulty.  We then went to the femur and used the intramedullary guide for the femur for making our distal femoral cut.  This was for a 10 mm distal femoral cut, setting this at 5 degrees externally rotated for a right knee.  We made this  cut without difficulty and brought the knee back down to full extension with a 9 mm extension block and achieved full extension.  We then went back to the femur and put our femoral sizing guide based off the epicondylar axis and Whitesides line.   Choosing a size 5 femur, we put a 4-in-1 cutting block for a size 5 femur and made our anterior and posterior  cuts followed by our chamfer cuts.  We then made our femoral box cut.  We then went back to the tibia and chose a size 6 tibial tray for  coverage and set our rotation off the tibial tubercle and the femur and made our keel punch and cut off of this.  We then placed the trial size 6 tibia followed by the trial size 5 femur.  We placed our size 9 and then up to 11 mm polyethylene  fixed-bearing insert, and then made our  patellar cut and drilled 3 holes for a size 32 patellar button.  With all trial instrumentation in place, we put the knee through a range of motion.  We were pleased with stability and range of motion.  We then  removed all trial instrumentation, irrigated the knee with normal saline solution using pulsatile lavage.  We then mixed our cement and cemented our real Stryker Triathlon tibial tray size 6, followed by our real size 5 right femur.  We placed our real  11 mm fixed bearing polyethylene insert and cemented our patellar button.  Once the cement had hardened, we removed cement debris from the knee.  The tourniquet was let down, and hemostasis was obtained with electrocautery.  We then irrigated the knee  with normal saline solution again and dried it.  We closed the arthrotomy with interrupted #1 Vicryl suture, followed by 0 in the deep tissue, 2-0 Vicryl subcutaneous tissue and interrupted staples on the skin.  Xeroform and a well-padded sterile  dressing was applied.  He was taken to recovery room in stable condition.  All final counts were correct.  There were no complications noted.  Of note, Rexene Edison, PA-C, assisted the entire case.  His assistance was crucial for facilitating all aspects of  this case.  LN/NUANCE  D:01/20/2018 T:01/20/2018 JOB:[id number]

## 2018-01-20 NOTE — Brief Op Note (Signed)
01/20/2018  4:49 PM  PATIENT:  Freda Munro  62 y.o. male  PRE-OPERATIVE DIAGNOSIS:  osteoarthritis right knee  POST-OPERATIVE DIAGNOSIS:  osteoarthritis right knee  PROCEDURE:  Procedure(s): RIGHT TOTAL KNEE ARTHROPLASTY (Right)  SURGEON:  Surgeon(s) and Role:    Kathryne Hitch, MD - Primary  PHYSICIAN ASSISTANT: Rexene Edison, PA-C   ANESTHESIA:   regional and spinal  EBL:  150 mL   COUNTS:  YES  TOURNIQUET:   Total Tourniquet Time Documented: Thigh (Right) - 71 minutes Total: Thigh (Right) - 71 minutes   DICTATION: .Other Dictation: Dictation Number [id number]  PLAN OF CARE: Admit to inpatient   PATIENT DISPOSITION:  PACU - hemodynamically stable.   Delay start of Pharmacological VTE agent (>24hrs) due to surgical blood loss or risk of bleeding: no

## 2018-01-21 ENCOUNTER — Other Ambulatory Visit: Payer: Self-pay

## 2018-01-21 LAB — BASIC METABOLIC PANEL
ANION GAP: 9 (ref 5–15)
BUN: 15 mg/dL (ref 8–23)
CO2: 26 mmol/L (ref 22–32)
Calcium: 8.9 mg/dL (ref 8.9–10.3)
Chloride: 103 mmol/L (ref 98–111)
Creatinine, Ser: 0.99 mg/dL (ref 0.61–1.24)
GLUCOSE: 161 mg/dL — AB (ref 70–99)
POTASSIUM: 4.2 mmol/L (ref 3.5–5.1)
Sodium: 138 mmol/L (ref 135–145)

## 2018-01-21 LAB — CBC
HCT: 40 % (ref 39.0–52.0)
Hemoglobin: 13.5 g/dL (ref 13.0–17.0)
MCH: 29.7 pg (ref 26.0–34.0)
MCHC: 33.8 g/dL (ref 30.0–36.0)
MCV: 88.1 fL (ref 80.0–100.0)
NRBC: 0 % (ref 0.0–0.2)
PLATELETS: 172 10*3/uL (ref 150–400)
RBC: 4.54 MIL/uL (ref 4.22–5.81)
RDW: 12.6 % (ref 11.5–15.5)
WBC: 11.3 10*3/uL — AB (ref 4.0–10.5)

## 2018-01-21 NOTE — Evaluation (Signed)
Physical Therapy Evaluation Patient Details Name: Jeffrey Brady MRN: [id number] DOB: [birth year] Today's Date: 01/21/2018   History of Present Illness   62 y.o. male, has a history of pain and functional disability in the right knee due to arthritis and has failed non-surgical conservative treatments for greater than 12 weeks. PMH HTN  Clinical Impression  Pt is s/p TKA resulting in the deficits listed below (see PT Problem List). Pt requires mod I for bed mobility, min A for transfers and minA-min guard for ambulation of 200 feet with RW.  Pt will benefit from skilled PT to increase their independence and safety with mobility to allow discharge to the venue listed below.      Follow Up Recommendations Home health PT;Supervision - Intermittent    Equipment Recommendations  Rolling walker with 5" wheels       Precautions / Restrictions Precautions Precautions: Fall Restrictions Weight Bearing Restrictions: Yes RLE Weight Bearing: Weight bearing as tolerated      Mobility  Bed Mobility Overal bed mobility: Modified Independent             General bed mobility comments: increased time and effort, use of bedrails and HOB eleveated  Transfers Overall transfer level: Needs assistance Equipment used: Rolling walker (2 wheeled) Transfers: Sit to/from Stand Sit to Stand: From elevated surface;Min assist         General transfer comment: min A for steadying in standing, c/o slight dizziness which dissipated quickly, vc for hand placement for powerup  Ambulation/Gait Ambulation/Gait assistance: Min guard;Min assist Gait Distance (Feet): 200 Feet Assistive device: Rolling walker (2 wheeled) Gait Pattern/deviations: Step-through pattern;Decreased weight shift to right;Decreased stance time - right;Decreased step length - left;Antalgic Gait velocity: slowed Gait velocity interpretation: 1.31 - 2.62 ft/sec, indicative of limited community ambulator General Gait Details:  min A progressing to min guard for steadying with RW, vc for sequencing and proximity to RW     Balance Overall balance assessment: Needs assistance Sitting-balance support: No upper extremity supported;Feet supported Sitting balance-Leahy Scale: Normal     Standing balance support: No upper extremity supported;During functional activity Standing balance-Leahy Scale: Fair                               Pertinent Vitals/Pain Pain Assessment: 0-10 Pain Score: 2  Pain Location: R knee Pain Descriptors / Indicators: Aching;Sore;Pressure Pain Intervention(s): Limited activity within patient's tolerance;Monitored during session;Repositioned    Home Living Family/patient expects to be discharged to:: Private residence Living Arrangements: Spouse/significant other Available Help at Discharge: Family;Available 24 hours/day Type of Home: House Home Access: Stairs to enter Entrance Stairs-Rails: None Entrance Stairs-Number of Steps: 3 Home Layout: One level Home Equipment: Crutches;Hand held shower head      Prior Function Level of Independence: Independent                  Extremity/Trunk Assessment   Upper Extremity Assessment Upper Extremity Assessment: Defer to OT evaluation    Lower Extremity Assessment Lower Extremity Assessment: RLE deficits/detail RLE Deficits / Details: R knee ROM limited by surgery, hip and ankle ROM WFL, hip and ankle strength grossly 4/5 RLE: Unable to fully assess due to pain RLE Sensation: WNL    Cervical / Trunk Assessment Cervical / Trunk Assessment: Normal  Communication   Communication: No difficulties  Cognition Arousal/Alertness: Awake/alert Behavior During Therapy: WFL for tasks assessed/performed Overall Cognitive Status: Within Functional Limits for tasks assessed  General Comments General comments (skin integrity, edema, etc.): VSS    Exercises Total  Joint Exercises Short Arc Quad: AROM;10 reps;Right;Seated Knee Flexion: AROM;AAROM;Right;10 reps;Supine   Assessment/Plan    PT Assessment Patient needs continued PT services  PT Problem List Decreased strength;Decreased range of motion;Decreased activity tolerance;Decreased mobility;Decreased knowledge of use of DME;Pain       PT Treatment Interventions DME instruction;Gait training;Stair training;Functional mobility training;Therapeutic exercise;Therapeutic activities;Balance training;Patient/family education    PT Goals (Current goals can be found in the Care Plan section)  Acute Rehab PT Goals Patient Stated Goal: have less pain with walking PT Goal Formulation: With patient Time For Goal Achievement: 02/04/18 Potential to Achieve Goals: Good    Frequency 7X/week    AM-PAC PT "6 Clicks" Daily Activity  Outcome Measure Difficulty turning over in bed (including adjusting bedclothes, sheets and blankets)?: A Little Difficulty moving from lying on back to sitting on the side of the bed? : Unable Difficulty sitting down on and standing up from a chair with arms (e.g., wheelchair, bedside commode, etc,.)?: Unable Help needed moving to and from a bed to chair (including a wheelchair)?: A Little Help needed walking in hospital room?: A Little Help needed climbing 3-5 steps with a railing? : A Lot 6 Click Score: 13    End of Session Equipment Utilized During Treatment: Gait belt Activity Tolerance: Patient tolerated treatment well Patient left: in chair;with call bell/phone within reach;with nursing/sitter in room Nurse Communication: Mobility status;Weight bearing status PT Visit Diagnosis: Other abnormalities of gait and mobility (R26.89);Muscle weakness (generalized) (M62.81);Difficulty in walking, not elsewhere classified (R26.2);Pain Pain - Right/Left: Right Pain - part of body: Knee    Time: 4098-1191 PT Time Calculation (min) (ACUTE ONLY): 37 min   Charges:   PT  Evaluation $PT Eval Low Complexity: 1 Low PT Treatments $Gait Training: 8-22 mins        Jeyren Danowski B. Beverely Risen PT, DPT Acute Rehabilitation Services Pager [phone] Office [phone]'   Elon Alas [facility] 01/21/2018, 8:58 AM

## 2018-01-21 NOTE — Discharge Instructions (Addendum)
Information on my medicine - XARELTO (Rivaroxaban)  This medication education was reviewed with me or my healthcare representative as part of my discharge preparation.  The pharmacist that spoke with me during my hospital stay was:  Lennon Alstrom, [facility]  Why was Xarelto prescribed for you? Xarelto was prescribed for you to reduce the risk of blood clots forming after orthopedic surgery. The medical term for these abnormal blood clots is venous thromboembolism (VTE).  What do you need to know about xarelto ? Take your Xarelto ONCE DAILY at the same time every day. You may take it either with or without food.   INSTRUCTIONS AFTER JOINT REPLACEMENT   o Remove items at home which could result in a fall. This includes throw rugs or furniture in walking pathways o ICE to the affected joint every three hours while awake for 30 minutes at a time, for at least the first 3-5 days, and then as needed for pain and swelling.  Continue to use ice for pain and swelling. You may notice swelling that will progress down to the foot and ankle.  This is normal after surgery.  Elevate your leg when you are not up walking on it.   o Continue to use the breathing machine you got in the hospital (incentive spirometer) which will help keep your temperature down.  It is common for your temperature to cycle up and down following surgery, especially at night when you are not up moving around and exerting yourself.  The breathing machine keeps your lungs expanded and your temperature down.   DIET:  As you were doing prior to hospitalization, we recommend a well-balanced diet.  DRESSING / WOUND CARE / SHOWERING  Keep the surgical dressing until follow up.  The dressing is water proof, so you can shower without any extra covering.  IF THE DRESSING FALLS OFF or the wound gets wet inside, change the dressing with sterile gauze.  Please use good hand washing techniques before changing the dressing.  Do not use any lotions  or creams on the incision until instructed by your surgeon.    ACTIVITY  o Increase activity slowly as tolerated, but follow the weight bearing instructions below.   o No driving for 6 weeks or until further direction given by your physician.  You cannot drive while taking narcotics.  o No lifting or carrying greater than 10 lbs. until further directed by your surgeon. o Avoid periods of inactivity such as sitting longer than an hour when not asleep. This helps prevent blood clots.  o You may return to work once you are authorized by your doctor.     WEIGHT BEARING   Weight bearing as tolerated with assist device (walker, cane, etc) as directed, use it as long as suggested by your surgeon or therapist, typically at least 4-6 weeks.   EXERCISES  Results after joint replacement surgery are often greatly improved when you follow the exercise, range of motion and muscle strengthening exercises prescribed by your doctor. Safety measures are also important to protect the joint from further injury. Any time any of these exercises cause you to have increased pain or swelling, decrease what you are doing until you are comfortable again and then slowly increase them. If you have problems or questions, call your caregiver or physical therapist for advice.   Rehabilitation is important following a joint replacement. After just a few days of immobilization, the muscles of the leg can become weakened and shrink (atrophy).  These  exercises are designed to build up the tone and strength of the thigh and leg muscles and to improve motion. Often times heat used for twenty to thirty minutes before working out will loosen up your tissues and help with improving the range of motion but do not use heat for the first two weeks following surgery (sometimes heat can increase post-operative swelling).   These exercises can be done on a training (exercise) mat, on the floor, on a table or on a bed. Use whatever works  the best and is most comfortable for you.    Use music or television while you are exercising so that the exercises are a pleasant break in your day. This will make your life better with the exercises acting as a break in your routine that you can look forward to.   Perform all exercises about fifteen times, three times per day or as directed.  You should exercise both the operative leg and the other leg as well.  Exercises include:    Quad Sets - Tighten up the muscle on the front of the thigh (Quad) and hold for 5-10 seconds.    Straight Leg Raises - With your knee straight (if you were given a brace, keep it on), lift the leg to 60 degrees, hold for 3 seconds, and slowly lower the leg.  Perform this exercise against resistance later as your leg gets stronger.   Leg Slides: Lying on your back, slowly slide your foot toward your buttocks, bending your knee up off the floor (only go as far as is comfortable). Then slowly slide your foot back down until your leg is flat on the floor again.   Angel Wings: Lying on your back spread your legs to the side as far apart as you can without causing discomfort.   Hamstring Strength:  Lying on your back, push your heel against the floor with your leg straight by tightening up the muscles of your buttocks.  Repeat, but this time bend your knee to a comfortable angle, and push your heel against the floor.  You may put a pillow under the heel to make it more comfortable if necessary.   A rehabilitation program following joint replacement surgery can speed recovery and prevent re-injury in the future due to weakened muscles. Contact your doctor or a physical therapist for more information on knee rehabilitation.    CONSTIPATION  Constipation is defined medically as fewer than three stools per week and severe constipation as less than one stool per week.  Even if you have a regular bowel pattern at home, your normal regimen is likely to be disrupted due to  multiple reasons following surgery.  Combination of anesthesia, postoperative narcotics, change in appetite and fluid intake all can affect your bowels.   YOU MUST use at least one of the following options; they are listed in order of increasing strength to get the job done.  They are all available over the counter, and you may need to use some, POSSIBLY even all of these options:    Drink plenty of fluids (prune juice may be helpful) and high fiber foods Colace 100 mg by mouth twice a day  Senokot for constipation as directed and as needed Dulcolax (bisacodyl), take with full glass of water  Miralax (polyethylene glycol) once or twice a day as needed.  If you have tried all these things and are unable to have a bowel movement in the first 3-4 days after surgery call either your  surgeon or your primary doctor.    If you experience loose stools or diarrhea, hold the medications until you stool forms back up.  If your symptoms do not get better within 1 week or if they get worse, check with your doctor.  If you experience "the worst abdominal pain ever" or develop nausea or vomiting, please contact the office immediately for further recommendations for treatment.   ITCHING:  If you experience itching with your medications, try taking only a single pain pill, or even half a pain pill at a time.  You can also use Benadryl over the counter for itching or also to help with sleep.   TED HOSE STOCKINGS:  Use stockings on both legs until for at least 2 weeks or as directed by physician office. They may be removed at night for sleeping.  MEDICATIONS:  See your medication summary on the After Visit Summary that nursing will review with you.  You may have some home medications which will be placed on hold until you complete the course of blood thinner medication.  It is important for you to complete the blood thinner medication as prescribed.  PRECAUTIONS:  If you experience chest pain or shortness of  breath - call 911 immediately for transfer to the hospital emergency department.   If you develop a fever greater that 101 F, purulent drainage from wound, increased redness or drainage from wound, foul odor from the wound/dressing, or calf pain - CONTACT YOUR SURGEON.                                                   FOLLOW-UP APPOINTMENTS:  If you do not already have a post-op appointment, please call the office for an appointment to be seen by your surgeon.  Guidelines for how soon to be seen are listed in your After Visit Summary, but are typically between 1-4 weeks after surgery.  OTHER INSTRUCTIONS:   Knee Replacement:  Do not place pillow under knee, focus on keeping the knee straight while resting. CPM instructions: 0-90 degrees, 2 hours in the morning, 2 hours in the afternoon, and 2 hours in the evening. Place foam block, curve side up under heel at all times except when in CPM or when walking.  DO NOT modify, tear, cut, or change the foam block in any way.  MAKE SURE YOU:   Understand these instructions.   Get help right away if you are not doing well or get worse.    Thank you for letting us be a part of your medical care team.  It is a privilege we respect greatly.  We hope these instructions will help you stay on track for a fast and full recovery!   If you have difficulty swallowing the tablet whole, you may crush it and mix in applesauce just prior to taking your dose.  Take Xarelto exactly as prescribed by your doctor and DO NOT stop taking Xarelto without talking to the doctor who prescribed the medication.  Stopping without other VTE prevention medication to take the place of Xarelto may increase your risk of developing a clot.  After discharge, you should have regular check-up appointments with your healthcare provider that is prescribing your Xarelto.    What do you do if you miss a dose? If you miss a dose, take it as soon as  you remember on the same day then  continue your regularly scheduled once daily regimen the next day. Do not take two doses of Xarelto on the same day.   Important Safety Information A possible side effect of Xarelto is bleeding. You should call your healthcare provider right away if you experience any of the following: ? Bleeding from an injury or your nose that does not stop. ? Unusual colored urine (red or dark brown) or unusual colored stools (red or black). ? Unusual bruising for unknown reasons. ? A serious fall or if you hit your head (even if there is no bleeding).  Some medicines may interact with Xarelto and might increase your risk of bleeding while on Xarelto. To help avoid this, consult your healthcare provider or pharmacist prior to using any new prescription or non-prescription medications, including herbals, vitamins, non-steroidal anti-inflammatory drugs (NSAIDs) and supplements.  This website has more information on Xarelto: [email or web address]

## 2018-01-21 NOTE — Progress Notes (Addendum)
Subjective: 1 Day Post-Op Procedure(s) (LRB): RIGHT TOTAL KNEE ARTHROPLASTY (Right) Patient reports pain as moderate. Feels good overall.  Therapy in the room. Objective: Vital signs in last 24 hours: Temp:  [97 F (36.1 C)-98.1 F (36.7 C)] 98 F (36.7 C) (10/16 0634) Pulse Rate:  [70-118] 102 (10/16 0634) Resp:  [14-22] 17 (10/16 0634) BP: (118-151)/(78-98) 118/83 (10/16 0634) SpO2:  [94 %-100 %] 96 % (10/16 0634)  Intake/Output from previous day: 10/15 0701 - 10/16 0700 In: 3440 [P.O.:1440; I.V.:2000] Out: 2000 [Urine:1850; Blood:150] Intake/Output this shift: No intake/output data recorded.  Recent Labs    01/21/18 0226  HGB 13.5   Recent Labs    01/21/18 0226  WBC 11.3*  RBC 4.54  HCT 40.0  PLT 172   Recent Labs    01/21/18 0226  NA 138  K 4.2  CL 103  CO2 26  BUN 15  CREATININE 0.99  GLUCOSE 161*  CALCIUM 8.9   No results for input(s): LABPT, INR in the last 72 hours.  Sensation intact distally Intact pulses distally Dorsiflexion/Plantar flexion intact Incision: dressing C/D/I  Assessment/Plan: 1 Day Post-Op Procedure(s) (LRB): RIGHT TOTAL KNEE ARTHROPLASTY (Right) Up with therapy  Likely discharge to home tomorrow    Kathryne Hitch 01/21/2018, 8:01 AM

## 2018-01-21 NOTE — Progress Notes (Signed)
Orthopedic Tech Progress Note Patient Details:  Jeffrey Brady 30-Jan-1956 [id number]  Patient ID: Jeffrey Brady, male   DOB: [birth year], 62 y.o.   MRN: [id number]   Jeffrey Brady 01/21/2018, 2:46 PM Placed pt's rle on cpm @ 0-60 degrees @1445 ; RN notified

## 2018-01-21 NOTE — Care Management Note (Signed)
Case Management Note  Patient Details  Name: Jeffrey Brady MRN: [id number] Date of Birth: October 04, 1955  Subjective/Objective:  62 yr old gentleman s/p right total knee arthroplasty.                   Action/Plan: Patient was preoperatively setup with [facility], they can not accept his insurance plan. Referral was called to Shon Millet, [facility] liaison. Patient's insurance requires he pay $49.73 per PT visit, patient is agreeable. Will have family support at discharge.   Expected Discharge Date:    01/22/18             Expected Discharge Plan:  Home w Home Health Services  In-House Referral:  NA  Discharge planning Services  CM Consult  Post Acute Care Choice:  Durable Medical Equipment, Home Health Choice offered to:  Patient  DME Arranged:  3-N-1, Walker rolling DME Agency:  [facility]  HH Arranged:  PT [facility] Agency:  [facility]  Status of Service:  Completed, signed off  If discussed at Long Length of Stay Meetings, dates discussed:    Additional Comments:  Durenda Guthrie, RN 01/21/2018, 4:12 PM

## 2018-01-21 NOTE — Progress Notes (Signed)
OT Cancellation Note  Patient Details Name: Jeffrey Brady MRN: [id number] DOB: 07-Aug-1955   Cancelled Treatment:    Reason Eval/Treat Not Completed: Patient at procedure or test/ unavailable(Pt was working with PT at the time.)  Emelda Fear 01/21/2018, 5:40 PM  Sherryl Manges OTR/L Acute Rehabilitation Services Pager: [phone] Office: [phone]

## 2018-01-21 NOTE — Plan of Care (Signed)
  Problem: Education: Goal: Knowledge of General Education information will improve Description Including pain rating scale, medication(s)/side effects and non-pharmacologic comfort measures Outcome: Progressing Note:  POC reviewed with pt. and pain management.   

## 2018-01-21 NOTE — Progress Notes (Signed)
Physical Therapy Treatment Patient Details Name: Jeffrey Brady MRN: [id number] DOB: 1955/07/23 Today's Date: 01/21/2018    History of Present Illness  62 y.o. male, has a history of pain and functional disability in the right knee due to arthritis and has failed non-surgical conservative treatments for greater than 12 weeks. PMH HTN    PT Comments    Pt with increased pain and RN administering pain medication on entry of therapist. Pt requests no mobility but is agreeable to bed exercises. Pt educated on need for pain management for increased mobility. Pt knee AROM currently 8 to 89 degrees. PT to initiate stair training tomorrow am for discharge.      Follow Up Recommendations  Home health PT;Supervision - Intermittent     Equipment Recommendations  Rolling walker with 5" wheels       Precautions / Restrictions Precautions Precautions: Fall Restrictions Weight Bearing Restrictions: Yes RLE Weight Bearing: Weight bearing as tolerated          Cognition Arousal/Alertness: Awake/alert Behavior During Therapy: WFL for tasks assessed/performed Overall Cognitive Status: Within Functional Limits for tasks assessed                                        Exercises Total Joint Exercises Ankle Circles/Pumps: AROM;Both;10 reps Quad Sets: AROM;Both;10 reps;Supine Gluteal Sets: AROM;Both;10 reps;Supine Heel Slides: AROM;Right;10 reps;Supine Straight Leg Raises: AROM;Both;10 reps;Supine Goniometric ROM: 8 to 89    General Comments General comments (skin integrity, edema, etc.): VSS      Pertinent Vitals/Pain Pain Assessment: 0-10 Pain Score: 8  Pain Location: R knee Pain Descriptors / Indicators: Aching;Sore;Pressure Pain Intervention(s): Limited activity within patient's tolerance;Premedicated before session;Repositioned;Monitored during session           PT Goals (current goals can now be found in the care plan section) Acute Rehab PT  Goals Patient Stated Goal: have less pain with walking PT Goal Formulation: With patient Time For Goal Achievement: 02/04/18 Potential to Achieve Goals: Good Progress towards PT goals: Not progressing toward goals - comment(limited by pain this afternoon)    Frequency    7X/week      PT Plan Current plan remains appropriate       AM-PAC PT "6 Clicks" Daily Activity  Outcome Measure  Difficulty turning over in bed (including adjusting bedclothes, sheets and blankets)?: A Little Difficulty moving from lying on back to sitting on the side of the bed? : Unable Difficulty sitting down on and standing up from a chair with arms (e.g., wheelchair, bedside commode, etc,.)?: Unable Help needed moving to and from a bed to chair (including a wheelchair)?: A Little Help needed walking in hospital room?: A Little Help needed climbing 3-5 steps with a railing? : A Lot 6 Click Score: 13    End of Session Equipment Utilized During Treatment: Gait belt Activity Tolerance: Patient tolerated treatment well Patient left: in chair;with call bell/phone within reach;with nursing/sitter in room Nurse Communication: Mobility status;Weight bearing status PT Visit Diagnosis: Other abnormalities of gait and mobility (R26.89);Muscle weakness (generalized) (M62.81);Difficulty in walking, not elsewhere classified (R26.2);Pain Pain - Right/Left: Right Pain - part of body: Knee     Time: 4098-1191 PT Time Calculation (min) (ACUTE ONLY): 24 min  Charges:  $Therapeutic Exercise: 23-37 mins                     Jeffrey Brady B. [employer]  PT, DPT Acute Rehabilitation Services Pager [phone] Office [phone]    Jeffrey Brady 01/21/2018, 3:20 PM

## 2018-01-22 MED ORDER — RIVAROXABAN 10 MG PO TABS: 10.0000 mg | ORAL_TABLET | Freq: Every day | ORAL | 0 refills | Status: DC

## 2018-01-22 MED ORDER — METHOCARBAMOL 500 MG PO TABS: 500.0000 mg | ORAL_TABLET | Freq: Four times a day (QID) | ORAL | 0 refills | Status: DC | PRN

## 2018-01-22 MED ORDER — OXYCODONE HCL 5 MG PO TABS: 5.0000 mg | ORAL_TABLET | ORAL | 0 refills | Status: DC | PRN

## 2018-01-22 NOTE — Evaluation (Signed)
Occupational Therapy Evaluation - late entry Patient Details Name: Jeffrey Brady MRN: [id number] DOB: 10-31-1955 Today's Date: 01/22/2018    History of Present Illness  61 y.o. male, has a history of pain and functional disability in the right knee due to arthritis and has failed non-surgical conservative treatments for greater than 12 weeks. PMH HTN   Clinical Impression   PTA Pt independent in ADL and mobility. Pt is currently mod to min A for LB ADL. Pt already dressed when OT entered room following PT session. Pt and wife educated on compensatory strategies for LB including verbally reviewing AE for LB. Pt is confident going home and has no questions about ADL at this time. OT education complete. OT to sign off at this time. Thank you for the opportunity to see this patient.     Follow Up Recommendations  No OT follow up    Equipment Recommendations  None recommended by OT    Recommendations for Other Services       Precautions / Restrictions Precautions Precautions: Fall Restrictions Weight Bearing Restrictions: Yes RLE Weight Bearing: Weight bearing as tolerated      Mobility Bed Mobility               General bed mobility comments: OOB in recliner on entry   Transfers Overall transfer level: Needs assistance Equipment used: Rolling walker (2 wheeled) Transfers: Sit to/from Stand Sit to Stand: Supervision         General transfer comment: good hand placement    Balance Overall balance assessment: Needs assistance Sitting-balance support: No upper extremity supported;Feet supported Sitting balance-Leahy Scale: Normal     Standing balance support: No upper extremity supported;During functional activity Standing balance-Leahy Scale: Fair Standing balance comment: able to perform static standing without UE support                           ADL either performed or assessed with clinical judgement   ADL Overall ADL's : Needs  assistance/impaired Eating/Feeding: Independent   Grooming: Supervision/safety;Standing Grooming Details (indicate cue type and reason): sink level ADL Upper Body Bathing: Supervision/ safety   Lower Body Bathing: Minimal assistance Lower Body Bathing Details (indicate cue type and reason): educated on use of long handle sponge Upper Body Dressing : Modified independent   Lower Body Dressing: Minimal assistance;With caregiver independent assisting;Sit to/from stand Lower Body Dressing Details (indicate cue type and reason): already dressed with assist from wife Toilet Transfer: Supervision/safety;Ambulation;RW   Toileting- Clothing Manipulation and Hygiene: Supervision/safety;Sit to/from stand   Tub/ Shower Transfer: Walk-in shower;Supervision/safety;With caregiver independent assisting;Ambulation;Rolling walker   Functional mobility during ADLs: Supervision/safety;Rolling walker General ADL Comments: pain limits access to LB for ADL     Vision Patient Visual Report: No change from baseline       Perception     Praxis      Pertinent Vitals/Pain Pain Assessment: 0-10 Pain Score: 3  Pain Location: R knee Pain Descriptors / Indicators: Aching;Sore;Pressure Pain Intervention(s): Monitored during session;Repositioned;Ice applied     Hand Dominance Right   Extremity/Trunk Assessment Upper Extremity Assessment Upper Extremity Assessment: Overall WFL for tasks assessed   Lower Extremity Assessment Lower Extremity Assessment: Defer to PT evaluation       Communication Communication Communication: No difficulties   Cognition Arousal/Alertness: Awake/alert Behavior During Therapy: WFL for tasks assessed/performed Overall Cognitive Status: Within Functional Limits for tasks assessed  General Comments  VSS    Exercises     Shoulder Instructions      Home Living Family/patient expects to be discharged to:: Private  residence Living Arrangements: Spouse/significant other Available Help at Discharge: Family;Available 24 hours/day Type of Home: House Home Access: Stairs to enter [employer] of Steps: 3 Entrance Stairs-Rails: None Home Layout: One level     Bathroom Shower/Tub: Producer, television/film/video: (comfort) Bathroom Accessibility: Yes How Accessible: Accessible via walker Home Equipment: Crutches;Hand held shower head          Prior Functioning/Environment Level of Independence: Independent                 OT Problem List: Decreased range of motion;Decreased activity tolerance;Impaired balance (sitting and/or standing);Decreased knowledge of use of DME or AE;Pain      OT Treatment/Interventions:      OT Goals(Current goals can be found in the care plan section) Acute Rehab OT Goals Patient Stated Goal: "get home today!" OT Goal Formulation: With patient Time For Goal Achievement: 02/05/18 Potential to Achieve Goals: Good  OT Frequency:     Barriers to D/C:            Co-evaluation              AM-PAC PT "6 Clicks" Daily Activity     Outcome Measure Help from another person eating meals?: None Help from another person taking care of personal grooming?: None Help from another person toileting, which includes using toliet, bedpan, or urinal?: None Help from another person bathing (including washing, rinsing, drying)?: A Little Help from another person to put on and taking off regular upper body clothing?: None Help from another person to put on and taking off regular lower body clothing?: A Little 6 Click Score: 22   End of Session Equipment Utilized During Treatment: Gait belt;Rolling walker CPM Right Knee CPM Right Knee: Off Nurse Communication: Mobility status  Activity Tolerance: Patient tolerated treatment well Patient left: in chair;with call bell/phone within reach;with family/visitor present  OT Visit Diagnosis: Other  abnormalities of gait and mobility (R26.89);Pain Pain - Right/Left: Right Pain - part of body: Knee                Time: 1610-9604 OT Time Calculation (min): 22 min Charges:  OT General Charges $OT Visit: 1 Visit OT Evaluation $OT Eval Moderate Complexity: 1 Mod  Jeffrey Brady OTR/L Acute Rehabilitation Services Pager: [phone] Office: [phone]  Jeffrey Brady 01/22/2018, 4:16 PM

## 2018-01-22 NOTE — Progress Notes (Signed)
Patient ID: Jeffrey Brady, male   DOB: [birth year], 62 y.o.   MRN: [id number] Doing well overall.  Can be discharged to home today.

## 2018-01-22 NOTE — Progress Notes (Signed)
RN gave pt discharge instructions and pt stated understanding. Pt did not have any questions, Pt prescriptions were escribed to the pharmacy. Pt wife packed up all belongings.

## 2018-01-22 NOTE — Discharge Summary (Signed)
Patient ID: Jeffrey Brady MRN: [id number] DOB/AGE: [birth year] 62 y.o.  Admit date: 01/20/2018 Discharge date: 01/22/2018  Admission Diagnoses:  Principal Problem:   Unilateral primary osteoarthritis, right knee Active Problems:   Status post total knee replacement, right   Discharge Diagnoses:  Same  Past Medical History:  Diagnosis Date  . Hypertension   . Sleep apnea    sleep study 30 years ago in [place]    Surgeries: Procedure(s): RIGHT TOTAL KNEE ARTHROPLASTY on 01/20/2018   Consultants:   Discharged Condition: Improved  Hospital Course: Jeffrey Brady is an 62 y.o. male who was admitted 01/20/2018 for operative treatment ofUnilateral primary osteoarthritis, right knee. Patient has severe unremitting pain that affects sleep, daily activities, and work/hobbies. After pre-op clearance the patient was taken to the operating room on 01/20/2018 and underwent  Procedure(s): RIGHT TOTAL KNEE ARTHROPLASTY.    Patient was given perioperative antibiotics:  Anti-infectives (From admission, onward)   Start     Dose/Rate Route Frequency Ordered Stop   01/20/18 2000  ceFAZolin (ANCEF) IVPB 2g/100 mL premix     2 g 200 mL/hr over 30 Minutes Intravenous Every 6 hours 01/20/18 1846 01/21/18 0229   01/20/18 1330  ceFAZolin (ANCEF) 3 g in dextrose 5 % 50 mL IVPB     3 g 100 mL/hr over 30 Minutes Intravenous To ShortStay Surgical 01/19/18 1351 01/20/18 1436       Patient was given sequential compression devices, early ambulation, and chemoprophylaxis to prevent DVT.  Patient benefited maximally from hospital stay and there were no complications.    Recent vital signs:  Patient Vitals for the past 24 hrs:  BP Temp Temp src Pulse Resp SpO2  01/22/18 0337 137/79 98.4 F (36.9 C) Oral 73 17 100 %  01/21/18 2155 - - - 70 16 96 %  01/21/18 2148 (!) 124/59 98.6 F (37 C) Oral 71 16 100 %  01/21/18 1405 128/77 98.2 F (36.8 C) Oral 97 - 99 %     Recent laboratory  studies:  Recent Labs    01/21/18 0226  WBC 11.3*  HGB 13.5  HCT 40.0  PLT 172  NA 138  K 4.2  CL 103  CO2 26  BUN 15  CREATININE 0.99  GLUCOSE 161*  CALCIUM 8.9     Discharge Medications:   Allergies as of 01/22/2018      Reactions   Nexium [esomeprazole Magnesium] Nausea And Vomiting, Other (See Comments)   Dehydrated      Medication List    STOP taking these medications   HYDROcodone-acetaminophen 5-325 MG tablet Commonly known as:  NORCO/VICODIN   ibuprofen 200 MG tablet Commonly known as:  ADVIL,MOTRIN     TAKE these medications   acetaminophen 500 MG tablet Commonly known as:  TYLENOL Take 500 mg by mouth every 6 (six) hours as needed for moderate pain or headache.   Krill Oil 500 MG Caps Take 500 mg by mouth daily.   lisinopril 5 MG tablet Commonly known as:  PRINIVIL,ZESTRIL Take 5 mg by mouth daily.   methocarbamol 500 MG tablet Commonly known as:  ROBAXIN Take 1 tablet (500 mg total) by mouth every 6 (six) hours as needed for muscle spasms.   ONE-A-DAY MENS PO Take 1 tablet by mouth daily.   OVER THE COUNTER MEDICATION Take 6 drops by mouth 2 (two) times daily. CBD Oil   oxyCODONE 5 MG immediate release tablet Commonly known as:  Oxy IR/ROXICODONE Take 1-2 tablets (5-10 mg total)  by mouth every 4 (four) hours as needed for moderate pain (pain score 4-6).   rivaroxaban 10 MG Tabs tablet Commonly known as:  XARELTO Take 1 tablet (10 mg total) by mouth daily with breakfast for 10 days.            Durable Medical Equipment  (From admission, onward)         Start     Ordered   01/20/18 1847  DME Walker rolling  Once    Question:  Patient needs a walker to treat with the following condition  Answer:  Status post total knee replacement, right   01/20/18 1846   01/20/18 1847  DME 3 n 1  Once     01/20/18 1846          Diagnostic Studies: Dg Knee Right Port  Result Date: 01/20/2018 CLINICAL DATA:  62 year old male post knee  replacement. Initial encounter. EXAM: PORTABLE RIGHT KNEE - 1-2 VIEW COMPARISON:  12/29/2017 outside plain film exam. FINDINGS: Post knee replacement. Requisition states right knee. Films are marked left knee although presented as if right knee. Operative report says right knee. The total knee prosthesis appears in satisfactory position without complication noted. IMPRESSION: Post total knee replacement. Please confirm laterality as discussed above. Electronically Signed   By: Lacy Duverney M.D.   On: 01/20/2018 17:39    Disposition: Discharge disposition: 01-Home or Self Care         Follow-up Information    Kathryne Hitch, MD Follow up in 2 week(s).   Specialty:  Orthopedic Surgery Contact information: [address] [place] [address] [phone]        [facility]-Home Follow up.   Specialty:  Home Health Services Why:  A representative from [facility] will contact you to arrange start date and time for your therapy. Contact information: [address] [place] [address] [phone]            Signed: Kathryne Hitch 01/22/2018, 7:06 AM

## 2018-01-22 NOTE — Progress Notes (Signed)
Physical Therapy Treatment Patient Details Name: Jeffrey Brady MRN: [id number] DOB: [birth year] Today's Date: 01/22/2018    History of Present Illness  62 y.o. male, has a history of pain and functional disability in the right knee due to arthritis and has failed non-surgical conservative treatments for greater than 12 weeks. PMH HTN    PT Comments    Pt with much better pain control with therapy this morning. Focus of session was gait and stair training for d/c home today. Pt is currently min guard for ambulation with RW and for ascent/descent of 3 steps. Pt with continued education on pain control and to insure he is maintain good form with ambulation with shorter bouts more often to achieve. PT will follow back this afternoon if pt is still here.    Follow Up Recommendations  Home health PT;Supervision - Intermittent     Equipment Recommendations  Rolling walker with 5" wheels       Precautions / Restrictions Precautions Precautions: Fall Restrictions Weight Bearing Restrictions: Yes RLE Weight Bearing: Weight bearing as tolerated    Mobility  Bed Mobility               General bed mobility comments: OOB in recliner on entry   Transfers Overall transfer level: Needs assistance Equipment used: Rolling walker (2 wheeled) Transfers: Sit to/from Stand Sit to Stand: Min guard         General transfer comment: min guard for safety, good power up and steadying  Ambulation/Gait Ambulation/Gait assistance: Min guard Gait Distance (Feet): 450 Feet Assistive device: Rolling walker (2 wheeled) Gait Pattern/deviations: Step-through pattern;Decreased weight shift to right;Decreased stance time - right;Decreased step length - left;Antalgic Gait velocity: slowed Gait velocity interpretation: >2.62 ft/sec, indicative of community ambulatory General Gait Details: min guard for safety, pt with slight R foot intoeing and decreased knee flexion/foot clearance with fatigue,  vc for correction of intoeing and increased knee flexion    Stairs Stairs: Yes Stairs assistance: Min guard Stair Management: Two rails;No rails;Step to pattern;Forwards Number of Stairs: 3 General stair comments: initally practiced ascent/descent with bilateral rails and progressed to no rail assist with min guard from therapist, vc for sequencing   Wheelchair Mobility    Modified Rankin (Stroke Patients Only)       Balance Overall balance assessment: Needs assistance Sitting-balance support: No upper extremity supported;Feet supported Sitting balance-Leahy Scale: Normal     Standing balance support: No upper extremity supported;During functional activity Standing balance-Leahy Scale: Fair                              Cognition Arousal/Alertness: Awake/alert Behavior During Therapy: WFL for tasks assessed/performed Overall Cognitive Status: Within Functional Limits for tasks assessed                                        Exercises Total Joint Exercises Long Arc Quad: AROM;10 reps;Right;Seated Knee Flexion: Right;10 reps;AROM;Seated    General Comments General comments (skin integrity, edema, etc.): VSS      Pertinent Vitals/Pain Pain Assessment: 0-10 Pain Score: 2  Pain Location: R knee Pain Descriptors / Indicators: Aching;Sore;Pressure Pain Intervention(s): Limited activity within patient's tolerance;Monitored during session;Repositioned           PT Goals (current goals can now be found in the care plan section) Acute Rehab PT Goals Patient Stated  Goal: have less pain with walking PT Goal Formulation: With patient Time For Goal Achievement: 02/04/18 Potential to Achieve Goals: Good Progress towards PT goals: Progressing toward goals    Frequency    7X/week      PT Plan Current plan remains appropriate       AM-PAC PT "6 Clicks" Daily Activity  Outcome Measure  Difficulty turning over in bed (including  adjusting bedclothes, sheets and blankets)?: A Little Difficulty moving from lying on back to sitting on the side of the bed? : A Little Difficulty sitting down on and standing up from a chair with arms (e.g., wheelchair, bedside commode, etc,.)?: Unable Help needed moving to and from a bed to chair (including a wheelchair)?: A Little Help needed walking in hospital room?: A Little Help needed climbing 3-5 steps with a railing? : A Lot 6 Click Score: 15    End of Session Equipment Utilized During Treatment: Gait belt Activity Tolerance: Patient tolerated treatment well Patient left: in chair;with call bell/phone within reach;with nursing/sitter in room Nurse Communication: Mobility status;Weight bearing status PT Visit Diagnosis: Other abnormalities of gait and mobility (R26.89);Muscle weakness (generalized) (M62.81);Difficulty in walking, not elsewhere classified (R26.2);Pain Pain - Right/Left: Right Pain - part of body: Knee     Time: 1610-9604 PT Time Calculation (min) (ACUTE ONLY): 27 min  Charges:  $Gait Training: 23-37 mins                     Jeffrey Brady B. Beverely Risen PT, DPT Acute Rehabilitation Services Pager [phone] Office [phone]    Jeffrey Brady 01/22/2018, 9:39 AM

## 2018-01-23 ENCOUNTER — Encounter (HOSPITAL_COMMUNITY): Payer: Self-pay | Admitting: Orthopaedic Surgery

## 2018-01-23 ENCOUNTER — Telehealth (INDEPENDENT_AMBULATORY_CARE_PROVIDER_SITE_OTHER): Payer: Self-pay

## 2018-01-23 NOTE — Telephone Encounter (Signed)
Ryan PT with [facility] called needing verbal orders for HHPT 1x/1, 3x/1, 2x/1 and then d/c to outpatient. I gave verbal for this. But he also is needing clarification on immobilizer that patient was sent home from hospital with as well as wound care orders with dressing. I advised to leave dressing on until first post op visit but they stated that patient had an extra bandage at home and was unsure what to do with it. Please advise. Contact for Ryan [phone]

## 2018-01-26 NOTE — Telephone Encounter (Signed)
The dressing should be left on his knee until his next follow-up appointment with [place].  I did give him an extra dressing to take home to change it 1 week postoperative if he needs it.  The knee immobilizer should only worn in bed.  It is okay for them to extend his therapy as well.

## 2018-01-26 NOTE — Telephone Encounter (Signed)
PT aware of the below message  

## 2018-01-26 NOTE — Telephone Encounter (Signed)
See below. Please advise.  

## 2018-01-28 ENCOUNTER — Telehealth (INDEPENDENT_AMBULATORY_CARE_PROVIDER_SITE_OTHER): Payer: Self-pay | Admitting: Orthopaedic Surgery

## 2018-01-28 MED ORDER — OXYCODONE HCL 5 MG PO TABS: 5.0000 mg | ORAL_TABLET | ORAL | 0 refills | Status: DC | PRN

## 2018-01-28 NOTE — Telephone Encounter (Signed)
Patient's wife called requesting an RX refill on his Oxycodone.  She was advised to call the pharmacy and did so, but the pharmacy advised her that they do not request refills on narcotics.  They use [facility] [place].  CB#[phone].  Thank you.

## 2018-01-28 NOTE — Telephone Encounter (Signed)
I sent some more in today.

## 2018-01-28 NOTE — Telephone Encounter (Signed)
Patients wife called back to make sure Dr. Magnus Ivan knows that patient will be out of oxycodone today, he was told not to run out. So it is very important that rx is called in as soon as possible. # [phone]

## 2018-01-28 NOTE — Telephone Encounter (Signed)
Please advise 

## 2018-02-03 ENCOUNTER — Encounter (INDEPENDENT_AMBULATORY_CARE_PROVIDER_SITE_OTHER): Payer: Self-pay | Admitting: Orthopaedic Surgery

## 2018-02-03 ENCOUNTER — Telehealth (INDEPENDENT_AMBULATORY_CARE_PROVIDER_SITE_OTHER): Payer: Self-pay | Admitting: Orthopaedic Surgery

## 2018-02-03 ENCOUNTER — Ambulatory Visit (HOSPITAL_COMMUNITY)
Admission: RE | Admit: 2018-02-03 | Discharge: 2018-02-03 | Disposition: A | Discharge status: Home / Self Care | Payer: No Typology Code available for payment source | Source: Ambulatory Visit | Attending: Orthopaedic Surgery | Admitting: Orthopaedic Surgery

## 2018-02-03 ENCOUNTER — Ambulatory Visit (INDEPENDENT_AMBULATORY_CARE_PROVIDER_SITE_OTHER): Payer: No Typology Code available for payment source | Admitting: Orthopaedic Surgery

## 2018-02-03 DIAGNOSIS — S8001XA Contusion of right knee, initial encounter: Secondary | ICD-10-CM | POA: Diagnosis not present

## 2018-02-03 DIAGNOSIS — R2242 Localized swelling, mass and lump, left lower limb: Secondary | ICD-10-CM | POA: Diagnosis not present

## 2018-02-03 DIAGNOSIS — M25561 Pain in right knee: Secondary | ICD-10-CM

## 2018-02-03 DIAGNOSIS — Z96651 Presence of right artificial knee joint: Secondary | ICD-10-CM | POA: Diagnosis present

## 2018-02-03 MED ORDER — OXYCODONE HCL 5 MG PO TABS: 5.0000 mg | ORAL_TABLET | ORAL | 0 refills | Status: DC | PRN

## 2018-02-03 NOTE — Telephone Encounter (Signed)
This ok? 

## 2018-02-03 NOTE — Progress Notes (Signed)
Right lower extremity venous duplex completed. Preliminary results - There is no evidence of a DVT or Baker's cyst. Kyley Solow,RVS 02/03/2018,10:22 AM

## 2018-02-03 NOTE — Telephone Encounter (Signed)
He can start ibuprofen.  Also, I did send in more oxycodone this morning.

## 2018-02-03 NOTE — Progress Notes (Signed)
The patient is 2 weeks today status post a right total knee arthroplasty.  He has been having home therapy and doing well overall.  He has had calf pain since he woke up from surgery.  He has been on Xarelto.  He just finished his Xarelto.  Notes from therapy state he is doing well but they would like to extend his home therapy for another week before proceeding outpatient therapy.  We agree with this as well since his insurance is more difficult in terms of finding the right therapy outpatient for him.  On exam he lacks full extension by 5 degrees and he can flex to 95 degrees.  The incision looks healed I remove the staples and placed Steri-Strips.  His calf is soft but he is tender more toward the popliteal area.  I would like to obtain a Doppler ultrasound today to rule out DVT.  I explained this to him in detail and they will call us if it is positive because we would then have him on Xarelto twice a day for 3 weeks and then once a day for up to 3 months.  However if it just shows a calf strain he does not any more blood thinners.  He will still work on aggressive physical therapy to get his knee bending and moving.  We will see him back in 4 weeks to see how is doing overall.  All question concerns were answered and addressed.  I did send in some more oxycodone for him.

## 2018-02-03 NOTE — Telephone Encounter (Signed)
Patient wanted to know if he can start on IBPROFREN finished xarelto. Also, Jeffrey Brady told him he would [facility] meds to [facility] [place] (Oxycodone)  Please call patient to advise.[phone]

## 2018-02-04 ENCOUNTER — Telehealth (INDEPENDENT_AMBULATORY_CARE_PROVIDER_SITE_OTHER): Payer: Self-pay | Admitting: Orthopaedic Surgery

## 2018-02-04 NOTE — Telephone Encounter (Signed)
Ok

## 2018-02-04 NOTE — Telephone Encounter (Signed)
That will be fine to fly then.  He should wear compression stockings on the flight and take a 325 mg full strength aspirin they day before he flies, the day he flies, and the day after he lands.  He would then repeat this when coming home.

## 2018-02-04 NOTE — Telephone Encounter (Signed)
Patient aware of the below message  

## 2018-02-04 NOTE — Telephone Encounter (Signed)
Patient aware of below message

## 2018-02-04 NOTE — Telephone Encounter (Signed)
Patient's wife Olegario Messier) called left voicemail message asking if patient will be able to fly  Out on a business trip to [facility] 03/06/18? The number to contact Olegario Messier is [phone]

## 2018-02-13 ENCOUNTER — Telehealth (INDEPENDENT_AMBULATORY_CARE_PROVIDER_SITE_OTHER): Payer: Self-pay | Admitting: Orthopaedic Surgery

## 2018-02-13 MED ORDER — OXYCODONE HCL 5 MG PO TABS: 5.0000 mg | ORAL_TABLET | ORAL | 0 refills | Status: DC | PRN

## 2018-02-13 NOTE — Telephone Encounter (Signed)
I sent some in 

## 2018-02-13 NOTE — Telephone Encounter (Signed)
Patient left a message requesting for you to give him a call back.  He did not specify on the message what he was calling about.  [phone].  Thank you.

## 2018-02-13 NOTE — Telephone Encounter (Signed)
Patient requesting refill of pain medication.

## 2018-02-13 NOTE — Telephone Encounter (Signed)
IC patient advised done.  

## 2018-03-03 ENCOUNTER — Ambulatory Visit (INDEPENDENT_AMBULATORY_CARE_PROVIDER_SITE_OTHER): Payer: No Typology Code available for payment source | Admitting: Orthopaedic Surgery

## 2018-03-03 ENCOUNTER — Encounter (INDEPENDENT_AMBULATORY_CARE_PROVIDER_SITE_OTHER): Payer: Self-pay | Admitting: Orthopaedic Surgery

## 2018-03-03 DIAGNOSIS — Z96651 Presence of right artificial knee joint: Secondary | ICD-10-CM

## 2018-03-03 MED ORDER — TIZANIDINE HCL 4 MG PO TABS: 4.0000 mg | ORAL_TABLET | Freq: Three times a day (TID) | ORAL | 0 refills | Status: DC | PRN

## 2018-03-03 MED ORDER — HYDROCODONE-ACETAMINOPHEN 5-325 MG PO TABS: 1.0000 | ORAL_TABLET | Freq: Four times a day (QID) | ORAL | 0 refills | Status: DC | PRN

## 2018-03-03 MED ORDER — IBUPROFEN 800 MG PO TABS: 800.0000 mg | ORAL_TABLET | Freq: Three times a day (TID) | ORAL | 0 refills | Status: DC | PRN

## 2018-03-03 NOTE — Progress Notes (Signed)
The patient is now 6 weeks status post right total knee arthroplasty.  He is a very active and young 62 year old gentleman.  He does have significant pain daily with that knee.  He is pushing it through in terms of getting his range of motion back.  On exam he lacks full extension by about 30 degrees and his flexion is well past 100 degrees.  His knee is swollen as expected.  His calf is soft.  Again reassurance that he is making excellent progress.  Pain in his young age is to be expected considering the extensive nature of total knee replacement surgery.  We are going to try a combination of hydrocodone, 800 mg ibuprofen and Zanaflex to see if this will help.  He can drive from my standpoint as long as he does not have a narcotic in the system.  He will continue aggressive motion of his knee.  We will see him back in 4 weeks to see how is doing overall.  No x-rays needed.

## 2018-03-20 ENCOUNTER — Telehealth (INDEPENDENT_AMBULATORY_CARE_PROVIDER_SITE_OTHER): Payer: Self-pay | Admitting: Orthopaedic Surgery

## 2018-03-20 MED ORDER — NABUMETONE 750 MG PO TABS: 750.0000 mg | ORAL_TABLET | Freq: Two times a day (BID) | ORAL | 2 refills | Status: DC | PRN

## 2018-03-20 NOTE — Telephone Encounter (Signed)
See message below. Can you send in another Rx?

## 2018-03-20 NOTE — Telephone Encounter (Signed)
I sent in some Relafen.

## 2018-03-20 NOTE — Telephone Encounter (Signed)
Patient states he was at physical therapy today and was told by the therapist to request a strong anti-inflammatory mediation.

## 2018-03-23 NOTE — Telephone Encounter (Signed)
Called patient to let him know.

## 2018-03-26 ENCOUNTER — Other Ambulatory Visit (INDEPENDENT_AMBULATORY_CARE_PROVIDER_SITE_OTHER): Payer: Self-pay | Admitting: Orthopaedic Surgery

## 2018-04-06 ENCOUNTER — Encounter (INDEPENDENT_AMBULATORY_CARE_PROVIDER_SITE_OTHER): Payer: Self-pay | Admitting: Orthopaedic Surgery

## 2018-04-06 ENCOUNTER — Ambulatory Visit (INDEPENDENT_AMBULATORY_CARE_PROVIDER_SITE_OTHER): Payer: No Typology Code available for payment source | Admitting: Orthopaedic Surgery

## 2018-04-06 DIAGNOSIS — Z96651 Presence of right artificial knee joint: Secondary | ICD-10-CM

## 2018-04-06 NOTE — Progress Notes (Signed)
The patient is now 10 weeks status post a right total knee arthroplasty.  He still not sleeping well at night but is not taking any narcotics either.  He says range of motion and strength are improving.  On exam he lacks full extension by maybe 3 degrees and I can flex him to 110 degrees on the right knee.  There is still some slight swelling but no significant effusion.  The knee feels a mostly stable.  I gave him reassurance that things will improve with time and he is motivated.  We will see him back in 3 months with a repeat AP and lateral of his right knee.

## 2018-07-03 ENCOUNTER — Telehealth (INDEPENDENT_AMBULATORY_CARE_PROVIDER_SITE_OTHER): Payer: Self-pay

## 2018-07-03 NOTE — Telephone Encounter (Signed)
Called pt appt was sch for 07/06/18. He had traveled to [facility] 3/9- 3/13 he does have a cough x 1 week but no to all other questions. 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 R/s appt for 07/13/18 3 weeks past return home. Pt voiced understanding and is happy with plan. Advised to send this message as an [place]

## 2018-07-06 ENCOUNTER — Ambulatory Visit (INDEPENDENT_AMBULATORY_CARE_PROVIDER_SITE_OTHER): Payer: No Typology Code available for payment source | Admitting: Orthopaedic Surgery

## 2018-07-10 ENCOUNTER — Telehealth (INDEPENDENT_AMBULATORY_CARE_PROVIDER_SITE_OTHER): Payer: Self-pay

## 2018-07-10 NOTE — Telephone Encounter (Signed)
Please see below and advise. We were going to reschedule him and he seemed frustrated

## 2018-07-10 NOTE — Telephone Encounter (Signed)
Called patient and asked the screening questions.  Do you have now or have you had in the past 7 days a fever and/or chills? NO  Do you have now or have you had in the past 7 days a cough? YES  Do you have now or have you had in the last 7 days nausea, vomiting or abdominal pain? NO  Have you been exposed to anyone who has tested positive for COVID-19? NO  Have you or anyone who lives with you traveled within the last month? YES  BODY ACHES AND SLIGHT COUGH. STATES HE HAD A SINUS INFECTION. LOOKS LIKE HE WAS R/S LAST WEEK ALSO.  R/S or see patient Monday? See previous message by Autumn.  Please call patient. Thanks.

## 2018-07-10 NOTE — Telephone Encounter (Signed)
I'm ok with rescheduling him just for safety purposes.  Offer him a telemedicine appointment with Dr. Prince Rome.

## 2018-07-10 NOTE — Telephone Encounter (Signed)
rescehduled

## 2018-07-13 ENCOUNTER — Ambulatory Visit (INDEPENDENT_AMBULATORY_CARE_PROVIDER_SITE_OTHER): Payer: No Typology Code available for payment source | Admitting: Orthopaedic Surgery

## 2018-07-23 ENCOUNTER — Other Ambulatory Visit (INDEPENDENT_AMBULATORY_CARE_PROVIDER_SITE_OTHER): Payer: Self-pay | Admitting: Orthopaedic Surgery

## 2018-07-23 ENCOUNTER — Ambulatory Visit (INDEPENDENT_AMBULATORY_CARE_PROVIDER_SITE_OTHER): Payer: No Typology Code available for payment source | Admitting: Orthopaedic Surgery

## 2018-07-23 ENCOUNTER — Other Ambulatory Visit: Payer: Self-pay

## 2018-07-23 ENCOUNTER — Ambulatory Visit (INDEPENDENT_AMBULATORY_CARE_PROVIDER_SITE_OTHER): Payer: No Typology Code available for payment source

## 2018-07-23 ENCOUNTER — Encounter (INDEPENDENT_AMBULATORY_CARE_PROVIDER_SITE_OTHER): Payer: Self-pay | Admitting: Orthopaedic Surgery

## 2018-07-23 DIAGNOSIS — Z96651 Presence of right artificial knee joint: Secondary | ICD-10-CM | POA: Diagnosis not present

## 2018-07-23 NOTE — Progress Notes (Signed)
Office Visit Note   Patient: Freda MunroLynn Curtis Tamburo           Date of Birth: 01/22/1956           MRN: [id number] Visit Date: 07/23/2018              Requested by: Crist FatVan Eyk, Jason, MD [address] [place], [place] [id number] PCP: Crist FatVan Eyk, Jason, MD   Assessment & Plan: Visit Diagnoses:  1. History of total right knee replacement     Plan: I gave him reassurance that what he is feeling is normal after knee replacement surgery in someone who is young with vibrant bone and that we will take time.  I am very pleased that his mobility and his range of motion of his knee as well as the stability.  I gave him reassurance that with time things should improve.  All question concerns were answered and addressed.  We will see him back for final visit in 6 months.  I would like an AP and lateral of the right knee at that visit.  Follow-Up Instructions: Return in about 6 months (around 01/22/2019).   Orders:  Orders Placed This Encounter  Procedures  . XR Knee 1-2 Views Right   No orders of the defined types were placed in this encounter.     Procedures: No procedures performed   Clinical Data: No additional findings.   Subjective: Chief Complaint  Patient presents with  . Right Knee - Follow-up  The patient is a very pleasant, active, and young appearing 63 year old gentleman who is quite active and is 6 months status post a right total knee arthroplasty.  He says the knee is still really bothering him daily.  Is really painful getting up after sitting for long period of time.  He is walking without a limp and without assistive device.  He does take 800 mg of ibuprofen twice daily.  He denies any other acute changes in medical status.  HPI  Review of Systems He currently denies any headache, chest pain, shortness of breath, fever, chills, nausea, vomiting  Objective: Vital Signs: There were no vitals taken for this visit.  Physical Exam He is alert and orient x3 and in no acute  distress Ortho Exam Examination of his right knee shows just some slight swelling.  There is warmth but no redness is noted.  I gave the patient reassurance that this is normal even 6 months after total knee replacement and pain me this way up to a year or so.  He has excellent range of motion of his right knee and it feels ligamentously stable. Specialty Comments:  No specialty comments available.  Imaging: Xr Knee 1-2 Views Right  Result Date: 07/23/2018 2 views of the right knee show a total knee arthroplasty with no complicating features.  The alignment is well-maintained.  There is no effusion.  There is no evidence of loosening.    PMFS History: Patient Active Problem List   Diagnosis Date Noted  . Status post total knee replacement, right 01/20/2018  . Unilateral primary osteoarthritis, right knee 12/29/2017   Past Medical History:  Diagnosis Date  . Hypertension   . Sleep apnea    sleep study 30 years ago in [place]    History reviewed. No pertinent family history.  Past Surgical History:  Procedure Laterality Date  . ELBOW SURGERY Right   . MENISCUS REPAIR    . NASAL SEPTUM SURGERY    . ROTATOR CUFF  REPAIR Right   . TOTAL KNEE ARTHROPLASTY Right 01/20/2018   Procedure: RIGHT TOTAL KNEE ARTHROPLASTY;  Surgeon: Kathryne Hitch, MD;  Location: [facility] OR;  Service: Orthopedics;  Laterality: Right;   Social History   Occupational History  . Not on file  Tobacco Use  . Smoking status: Former Smoker    Types: Cigarettes  . Smokeless tobacco: Never Used  Substance and Sexual Activity  . Alcohol use: Yes    Comment: occasional  . Drug use: Never  . Sexual activity: Not on file

## 2018-09-05 ENCOUNTER — Other Ambulatory Visit (INDEPENDENT_AMBULATORY_CARE_PROVIDER_SITE_OTHER): Payer: Self-pay | Admitting: Orthopaedic Surgery

## 2018-09-06 NOTE — Telephone Encounter (Signed)
Rx request 

## 2018-10-02 ENCOUNTER — Other Ambulatory Visit (INDEPENDENT_AMBULATORY_CARE_PROVIDER_SITE_OTHER): Payer: Self-pay | Admitting: Orthopaedic Surgery

## 2018-11-04 ENCOUNTER — Other Ambulatory Visit (INDEPENDENT_AMBULATORY_CARE_PROVIDER_SITE_OTHER): Payer: Self-pay | Admitting: Orthopaedic Surgery

## 2018-12-08 ENCOUNTER — Other Ambulatory Visit (INDEPENDENT_AMBULATORY_CARE_PROVIDER_SITE_OTHER): Payer: Self-pay | Admitting: Orthopaedic Surgery

## 2019-01-04 ENCOUNTER — Other Ambulatory Visit (INDEPENDENT_AMBULATORY_CARE_PROVIDER_SITE_OTHER): Payer: Self-pay | Admitting: Orthopaedic Surgery

## 2019-01-25 ENCOUNTER — Encounter: Payer: Self-pay | Admitting: Orthopaedic Surgery

## 2019-01-25 ENCOUNTER — Ambulatory Visit (INDEPENDENT_AMBULATORY_CARE_PROVIDER_SITE_OTHER): Payer: Medicare Other

## 2019-01-25 ENCOUNTER — Ambulatory Visit (INDEPENDENT_AMBULATORY_CARE_PROVIDER_SITE_OTHER): Payer: Medicare Other | Admitting: Orthopaedic Surgery

## 2019-01-25 ENCOUNTER — Other Ambulatory Visit: Payer: Self-pay

## 2019-01-25 DIAGNOSIS — Z96651 Presence of right artificial knee joint: Secondary | ICD-10-CM

## 2019-01-25 NOTE — Progress Notes (Signed)
The patient is now 1 year status post a right total knee arthroplasty.  He still having low-grade clicking in the knee some pain in the lateral portion but overall he feels like he is making progress.  He says he is definitely better than what he was before surgery.  He is walking without a limp or assistive device.  Examination of the right operative knee shows a well-healed surgical incision.  There is minimal swelling.  His range of motion is full and the knee feels ligamentously stable.  2 views of the right knee show well-seated total knee arthroplasty with no complicating features.  There is no evidence of loosening.  There is no malalignment.  At this point he will continue to work on aggressive quad training exercises I showed him how to do these.  He is to work on calf exercises as well.  I would like to see him back in 6 months to see how he is doing overall.  We can have a final AP and lateral of his right knee at that visit.

## 2019-07-12 ENCOUNTER — Other Ambulatory Visit (INDEPENDENT_AMBULATORY_CARE_PROVIDER_SITE_OTHER): Payer: Self-pay | Admitting: Orthopaedic Surgery

## 2019-07-26 ENCOUNTER — Ambulatory Visit (INDEPENDENT_AMBULATORY_CARE_PROVIDER_SITE_OTHER): Payer: Medicare Other

## 2019-07-26 ENCOUNTER — Other Ambulatory Visit: Payer: Self-pay

## 2019-07-26 ENCOUNTER — Encounter: Payer: Self-pay | Admitting: Orthopaedic Surgery

## 2019-07-26 ENCOUNTER — Ambulatory Visit (INDEPENDENT_AMBULATORY_CARE_PROVIDER_SITE_OTHER): Payer: Medicare Other | Admitting: Orthopaedic Surgery

## 2019-07-26 DIAGNOSIS — Z96651 Presence of right artificial knee joint: Secondary | ICD-10-CM

## 2019-07-26 MED ORDER — LIDOCAINE HCL 1 % IJ SOLN
3.0000 mL | INTRAMUSCULAR | Status: AC | PRN
  Administered 2019-07-26: 09:00:00 3 mL

## 2019-07-26 MED ORDER — METHYLPREDNISOLONE ACETATE 40 MG/ML IJ SUSP
40.0000 mg | INTRAMUSCULAR | Status: AC | PRN
  Administered 2019-07-26: 40 mg via INTRA_ARTICULAR

## 2019-07-26 NOTE — Progress Notes (Signed)
Office Visit Note   Patient: Jeffrey Brady           Date of Birth: March 09, 1956           MRN: [id number] Visit Date: 07/26/2019              Requested by: Crist Fat, MD [address] [place],  [place] [address] PCP: Crist Fat, MD   Assessment & Plan: Visit Diagnoses:  1. History of total right knee replacement     Plan: I felt that it was worthwhile to try a steroid injection around the area of maximal point tenderness on the lateral aspect of his knee that is around the IT band of the knee joint itself.  He tolerated this well.  I would like him to try Voltaren gel at least twice daily on this area.  All questions and concerns were answered and addressed.  I like to see him back in 4 weeks to see if that he has had a good response from this.  Follow-Up Instructions: Return in about 4 weeks (around 08/23/2019).   Orders:  Orders Placed This Encounter  Procedures  . Large Joint Inj  . XR Knee 1-2 Views Right   No orders of the defined types were placed in this encounter.     Procedures: Large Joint Inj: R knee on 07/26/2019 8:30 AM Indications: diagnostic evaluation and pain Details: 22 G 1.5 in needle, superolateral approach  Arthrogram: No  Medications: 3 mL lidocaine 1 %; 40 mg methylPREDNISolone acetate 40 MG/ML Outcome: tolerated well, no immediate complications Procedure, treatment alternatives, risks and benefits explained, specific risks discussed. Consent was given by the patient. Immediately prior to procedure a time out was called to verify the correct patient, procedure, equipment, support staff and site/side marked as required. Patient was prepped and draped in the usual sterile fashion.       Clinical Data: No additional findings.   Subjective: Chief Complaint  Patient presents with  . Right Knee - Follow-up  The patient is now 18 months status post a right total knee arthroplasty.  He says he still has pain from the lateral aspect of  his knee down to his big toe.  He says it is tender to touch on the lateral aspect of his knee.  He denies any symptoms of instability.  He says that sometimes warm on the lateral aspect of his knee.  HPI  Review of Systems He currently denies any headache, chest pain, shortness of breath, fever, chills, nausea, vomiting he denies any acute changes in medical status.  Again he denies any instability of his right knee.  Objective: Vital Signs: There were no vitals taken for this visit.  Physical Exam He is alert and orient x3 and in no acute distress Ortho Exam Examination of his right knee shows that it has full range of motion.  The incision is well-healed.  There is no instability on clinical exam of the knee.  There is some pain laterally over the IT band and somewhat of the knee joint.  There is no crepitation. Specialty Comments:  No specialty comments available.  Imaging: XR Knee 1-2 Views Right  Result Date: 07/26/2019 An AP and lateral of the right knee shows a well-seated total knee arthroplasty with no complicating features.  There is no evidence of malalignment.  There is no effusion.    PMFS History: Patient Active Problem List   Diagnosis Date Noted  . Status  post total knee replacement, right 01/20/2018  . Unilateral primary osteoarthritis, right knee 12/29/2017   Past Medical History:  Diagnosis Date  . Hypertension   . Sleep apnea    sleep study 30 years ago in [place]    History reviewed. No pertinent family history.  Past Surgical History:  Procedure Laterality Date  . ELBOW SURGERY Right   . MENISCUS REPAIR    . NASAL SEPTUM SURGERY    . ROTATOR CUFF REPAIR Right   . TOTAL KNEE ARTHROPLASTY Right 01/20/2018   Procedure: RIGHT TOTAL KNEE ARTHROPLASTY;  Surgeon: Mcarthur Rossetti, MD;  Location: [facility];  Service: Orthopedics;  Laterality: Right;   Social History   Occupational History  . Not on file  Tobacco Use  . Smoking status: Former Smoker     Types: Cigarettes  . Smokeless tobacco: Never Used  Substance and Sexual Activity  . Alcohol use: Yes    Comment: occasional  . Drug use: Never  . Sexual activity: Not on file

## 2019-08-05 ENCOUNTER — Other Ambulatory Visit (INDEPENDENT_AMBULATORY_CARE_PROVIDER_SITE_OTHER): Payer: Self-pay | Admitting: Orthopaedic Surgery

## 2019-08-23 ENCOUNTER — Telehealth: Payer: Self-pay

## 2019-08-23 ENCOUNTER — Ambulatory Visit (INDEPENDENT_AMBULATORY_CARE_PROVIDER_SITE_OTHER): Payer: Medicare Other | Admitting: Orthopaedic Surgery

## 2019-08-23 ENCOUNTER — Other Ambulatory Visit: Payer: Self-pay

## 2019-08-23 ENCOUNTER — Encounter: Payer: Self-pay | Admitting: Orthopaedic Surgery

## 2019-08-23 DIAGNOSIS — G8929 Other chronic pain: Secondary | ICD-10-CM

## 2019-08-23 DIAGNOSIS — Z96651 Presence of right artificial knee joint: Secondary | ICD-10-CM | POA: Diagnosis not present

## 2019-08-23 DIAGNOSIS — M25561 Pain in right knee: Secondary | ICD-10-CM | POA: Diagnosis not present

## 2019-08-23 NOTE — Progress Notes (Signed)
The patient is still having activity related knee pain with his right total knee arthroplasty.  We replaced the knee in October 2019.  At his last visit he was having lateral tenderness I did inject around the IT band.  He does still report some lateral tenderness and tenderness around the lateral quad area and behind the knee.  He denies any symptoms of instability with that knee.  It is mainly activity related with twitching in different activities where he will get a sharp pain with his knee.  He denies any significant swelling either.  On my exam today the knee feels ligamentously stable when I examined the right knee and he has great range of motion.  There is no muscle atrophy that I can see and there is no effusion.  The incision has healed nicely.  It is certainly worth trying some outpatient physical therapy with any modalities that can help strengthen the quad muscles and decrease the pain around the lateral aspect and posteriorly with in his knee.  He agrees this treatment plan and I gave him a prescription for outpatient physical therapy near where he lives.  All questions and concerns were answered and addressed.  Would like to see him back in 6 weeks to see if he has had a good response to therapy.

## 2019-08-23 NOTE — Telephone Encounter (Signed)
Judeth Cornfield with [facility] would like last office note and OP note faxed to [phone].  CB# [phone].  Please advise.  Thank you.

## 2019-08-25 NOTE — Telephone Encounter (Signed)
done

## 2019-08-25 NOTE — Telephone Encounter (Signed)
Can you help me out with this and fax, thanks!! :)

## 2019-08-30 ENCOUNTER — Other Ambulatory Visit (INDEPENDENT_AMBULATORY_CARE_PROVIDER_SITE_OTHER): Payer: Self-pay | Admitting: Orthopaedic Surgery

## 2019-10-12 ENCOUNTER — Telehealth: Payer: Self-pay | Admitting: Orthopaedic Surgery

## 2019-10-12 NOTE — Telephone Encounter (Signed)
Patient called, requesting xays of R Knee. Please call when ready. [phone]. Will sign release when comes to pick up.

## 2019-10-12 NOTE — Telephone Encounter (Signed)
Called patient and advised that CD was ready for pickup at front desk.  °

## 2019-10-20 ENCOUNTER — Ambulatory Visit: Payer: Medicare Other | Admitting: Orthopaedic Surgery

## 2019-12-27 ENCOUNTER — Other Ambulatory Visit (INDEPENDENT_AMBULATORY_CARE_PROVIDER_SITE_OTHER): Payer: Self-pay | Admitting: Orthopaedic Surgery

## 2020-04-13 ENCOUNTER — Encounter (HOSPITAL_COMMUNITY): Payer: Self-pay

## 2020-04-13 ENCOUNTER — Encounter (HOSPITAL_COMMUNITY)
Admission: RE | Admit: 2020-04-13 | Discharge: 2020-04-13 | Disposition: A | Discharge status: Home / Self Care | Payer: Medicare Other | Source: Ambulatory Visit | Attending: Orthopedic Surgery | Admitting: Orthopedic Surgery

## 2020-04-13 ENCOUNTER — Other Ambulatory Visit: Payer: Self-pay

## 2020-04-13 DIAGNOSIS — Z01818 Encounter for other preprocedural examination: Secondary | ICD-10-CM | POA: Insufficient documentation

## 2020-04-13 HISTORY — DX: Prediabetes: R73.03

## 2020-04-13 HISTORY — DX: Personal history of urinary calculi: Z87.442

## 2020-04-13 HISTORY — DX: Unspecified osteoarthritis, unspecified site: M19.90

## 2020-04-13 LAB — SURGICAL PCR SCREEN
MRSA, PCR: NEGATIVE
Staphylococcus aureus: NEGATIVE

## 2020-04-13 LAB — BASIC METABOLIC PANEL
Anion gap: 7 (ref 5–15)
BUN: 17 mg/dL (ref 8–23)
CO2: 28 mmol/L (ref 22–32)
Calcium: 9.7 mg/dL (ref 8.9–10.3)
Chloride: 103 mmol/L (ref 98–111)
Creatinine, Ser: 0.84 mg/dL (ref 0.61–1.24)
GFR, Estimated: >60 mL/min (ref >60)
Glucose, Bld: 105 mg/dL — ABNORMAL HIGH (ref 70–99)
Potassium: 4.3 mmol/L (ref 3.5–5.1)
Sodium: 138 mmol/L (ref 135–145)

## 2020-04-13 LAB — CBC
HCT: 41.9 % (ref 39.0–52.0)
Hemoglobin: 14.3 g/dL (ref 13.0–17.0)
MCH: 30.5 pg (ref 26.0–34.0)
MCHC: 34.1 g/dL (ref 30.0–36.0)
MCV: 89.3 fL (ref 80.0–100.0)
Platelets: 178 10*3/uL (ref 150–400)
RBC: 4.69 MIL/uL (ref 4.22–5.81)
RDW: 13.2 % (ref 11.5–15.5)
WBC: 4.9 10*3/uL (ref 4.0–10.5)
nRBC: 0 % (ref 0.0–0.2)

## 2020-04-13 NOTE — Progress Notes (Signed)
       Anesthesia Review:  PCP: Dr Pollyann Kennedy- preop  visit 02/15/2020 and office visit of 04/04/2020  Cardiologist : Chest x-ray : EKG :12/08/19 and 04/13/2020  Echo : Stress test: Cardiac Cath :  Activity level:  Sleep Study/ CPAP : Fasting Blood Sugar :      / Checks Blood Sugar -- times a day:   Blood Thinner/ Instructions /Last Dose: ASA / Instructions/ Last Dose : Prediabetes- does not check glucose at home  HGBAQ!C- 02/15/2020-5.8

## 2020-04-13 NOTE — Progress Notes (Signed)
DUE TO COVID-19 ONLY ONE VISITOR IS ALLOWED TO COME WITH YOU AND STAY IN THE WAITING ROOM ONLY DURING PRE OP AND PROCEDURE DAY OF SURGERY. THE 1 VISITOR  MAY VISIT WITH YOU AFTER SURGERY IN YOUR PRIVATE ROOM DURING VISITING HOURS ONLY!  YOU NEED TO HAVE A COVID 19 TEST ON_1/13/2022 ______ @_______ , THIS TEST MUST BE DONE BEFORE SURGERY,  COVID TESTING SITE [address] [place]  , IT IS ON THE RIGHT GOING OUT [address] APPROXIMATELY  2 MINUTES PAST [employer] ON THE RIGHT. ONCE YOUR COVID TEST IS COMPLETED,  PLEASE BEGIN THE QUARANTINE INSTRUCTIONS AS OUTLINED IN YOUR HANDOUT.                Jeffrey Brady  04/13/2020   Your procedure is scheduled on: 04/24/2020    Report to [facility] Main  Entrance   Report to admitting at  0515 AM     Call this number if you have problems the morning of surgery [phone]    Remember: Do not eat food , candy gum or mints :After Midnight. You may have clear liquids from midnight until 0415am     CLEAR LIQUID DIET   Foods Allowed                                                                       Coffee and tea, regular and decaf                              Plain Jell-O any favor except red or purple                                            Fruit ices (not with fruit pulp)                                      Iced Popsicles                                     Carbonated beverages, regular and diet                                    Cranberry, grape and apple juices Sports drinks like Gatorade Lightly seasoned clear broth or consume(fat free) Sugar, honey syrup   _____________________________________________________________________    BRUSH YOUR TEETH MORNING OF SURGERY AND RINSE YOUR MOUTH OUT, NO CHEWING GUM CANDY OR MINTS.     Take these medicines the morning of surgery with A SIP OF WATER: none   DO NOT TAKE ANY DIABETIC MEDICATIONS DAY OF YOUR SURGERY                                You may not have any metal on your body including  hair pins and              piercings  Do not wear jewelry, make-up, lotions, powders or perfumes, deodorant             Do not wear nail polish on your fingernails.  Do not shave  48 hours prior to surgery.              Men may shave face and neck.   Do not bring valuables to the hospital. [place].  Contacts, dentures or bridgework may not be worn into surgery.  Leave suitcase in the car. After surgery it may be brought to your room.     Patients discharged the day of surgery will not be allowed to drive home. IF YOU ARE HAVING SURGERY AND GOING HOME THE SAME DAY, YOU MUST HAVE AN ADULT TO DRIVE YOU HOME AND BE WITH YOU FOR 24 HOURS. YOU MAY GO HOME BY TAXI OR UBER OR ORTHERWISE, BUT AN ADULT MUST ACCOMPANY YOU HOME AND STAY WITH YOU FOR 24 HOURS.  Name and phone number of your driver:  Special Instructions: N/A              Please read over the following fact sheets you were given: _____________________________________________________________________  [facility] - Preparing for Surgery Before surgery, you can play an important role.  Because skin is not sterile, your skin needs to be as free of germs as possible.  You can reduce the number of germs on your skin by washing with CHG (chlorahexidine gluconate) soap before surgery.  CHG is an antiseptic cleaner which kills germs and bonds with the skin to continue killing germs even after washing. Please DO NOT use if you have an allergy to CHG or antibacterial soaps.  If your skin becomes reddened/irritated stop using the CHG and inform your nurse when you arrive at Short Stay. Do not shave (including legs and underarms) for at least 48 hours prior to the first CHG shower.  You may shave your face/neck. Please follow these instructions carefully:  1.  Shower with CHG Soap the night before surgery and the  morning of Surgery.  2.  If you  choose to wash your hair, wash your hair first as usual with your  normal  shampoo.  3.  After you shampoo, rinse your hair and body thoroughly to remove the  shampoo.                           4.  Use CHG as you would any other liquid soap.  You can apply chg directly  to the skin and wash                       Gently with a scrungie or clean washcloth.  5.  Apply the CHG Soap to your body ONLY FROM THE NECK DOWN.   Do not use on face/ open                           Wound or open sores. Avoid contact with eyes, ears mouth and genitals (private parts).                       Wash face,  Genitals (private parts) with  your normal soap.             6.  Wash thoroughly, paying special attention to the area where your surgery  will be performed.  7.  Thoroughly rinse your body with warm water from the neck down.  8.  DO NOT shower/wash with your normal soap after using and rinsing off  the CHG Soap.                9.  Pat yourself dry with a clean towel.            10.  Wear clean pajamas.            11.  Place clean sheets on your bed the night of your first shower and do not  sleep with pets. Day of Surgery : Do not apply any lotions/deodorants the morning of surgery.  Please wear clean clothes to the hospital/surgery center.  FAILURE TO FOLLOW THESE INSTRUCTIONS MAY RESULT IN THE CANCELLATION OF YOUR SURGERY PATIENT SIGNATURE_________________________________  NURSE SIGNATURE__________________________________  ________________________________________________________________________

## 2020-04-14 ENCOUNTER — Other Ambulatory Visit: Payer: Self-pay | Admitting: Orthopedic Surgery

## 2020-04-19 NOTE — Progress Notes (Signed)
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 former smoker with h/o HTN, sleep apnea with CPAP, right knee failed total arthroplasty scheduled for above procedure 04/24/20 with Dr. Dannielle Huh.   Pt seen by PCP 02/15/2020 for preoperative evaluation. Cleared from medical standpoint.   Pt last seen by cardiology 12/08/2019, followed for SVT, OSA.  Per OV note, "At this time, Mr. Loden is doing very well from a cardiovascular standpoint. He does not need any further evaluation or management of these SVT episodes noted on his Zio patch."  Anticipate pt can proceed with planned procedure barring acute status change.   VS: BP (!) 168/78   Pulse (!) 58   Temp 37.1 C (Oral)   Resp 18   Ht 6\' 3"  (1.905 m)   Wt 134.7 kg   SpO2 96%   BMI 37.12 kg/m   PROVIDERS: , FNP is PCP   Eather Colas, MD is Cardiologist with [facility] Medical LABS: Labs reviewed: Acceptable for surgery. (all labs ordered are listed, but only abnormal results are displayed)  Labs Reviewed  BASIC METABOLIC PANEL - Abnormal; Notable for the following components:      Result Value   Glucose, Bld 105 (*)    All other components within normal limits  SURGICAL PCR SCREEN  CBC     IMAGES:   EKG: 04/13/20 Rate 55 bpm  Sinus bradycardia  Left axis deviation  Right bundle branch block  Minimal voltage criteria for LVH, may be normal variant  CV:  Past Medical History:  Diagnosis Date  . Arthritis   . History of kidney stones   . Hypertension   . Pre-diabetes   . Sleep apnea    sleep study 30 years ago in [place] cpap     Past Surgical History:  Procedure Laterality Date  . ELBOW SURGERY Right   . MENISCUS  REPAIR    . NASAL SEPTUM SURGERY    . ROTATOR CUFF REPAIR Right   . TOTAL KNEE ARTHROPLASTY Right 01/20/2018   Procedure: RIGHT TOTAL KNEE ARTHROPLASTY;  Surgeon: 01/22/2018, MD;  Location: [facility];  Service: Orthopedics;  Laterality: Right;    MEDICATIONS: . Coenzyme Q10 (COQ-10) 100 MG CAPS  . doxycycline (VIBRAMYCIN) 100 MG capsule  . ibuprofen (ADVIL) 800 MG tablet  . lisinopril (ZESTRIL) 10 MG tablet   No current facility-administered medications for this encounter.    Kathryne Hitch, PA-C [facility] Pre-Surgical Testing [phone]

## 2020-04-19 NOTE — Anesthesia Preprocedure Evaluation (Addendum)
Anesthesia Evaluation  Patient identified by MRN, date of birth, ID band Patient awake    Reviewed: Allergy & Precautions, H&P , NPO status , Patient's Chart, lab work & pertinent test results  Airway Mallampati: II  TM Distance: >3 FB Neck ROM: Full    Dental no notable dental hx.    Pulmonary sleep apnea and Continuous Positive Airway Pressure Ventilation , former smoker,    Pulmonary exam normal breath sounds clear to auscultation       Cardiovascular hypertension, Normal cardiovascular exam Rhythm:Regular Rate:Normal     Neuro/Psych negative neurological ROS  negative psych ROS   GI/Hepatic negative GI ROS, Neg liver ROS,   Endo/Other  negative endocrine ROS  Renal/GU negative Renal ROS  negative genitourinary   Musculoskeletal  (+) Arthritis , Osteoarthritis,    Abdominal   Peds negative pediatric ROS (+)  Hematology negative hematology ROS (+)   Anesthesia Other Findings   Reproductive/Obstetrics negative OB ROS                            Anesthesia Physical Anesthesia Plan  ASA: II  Anesthesia Plan: Spinal   Post-op Pain Management:  Regional for Post-op pain   Induction: Intravenous  PONV Risk Score and Plan: Ondansetron and Dexamethasone  Airway Management Planned: Simple Face Mask  Additional Equipment:   Intra-op Plan:   Post-operative Plan:   Informed Consent: I have reviewed the patients History and Physical, chart, labs and discussed the procedure including the risks, benefits and alternatives for the proposed anesthesia with the patient or authorized representative who has indicated his/her understanding and acceptance.     Dental advisory given  Plan Discussed with: CRNA and Surgeon  Anesthesia Plan Comments: (See PAT note 04/16/2020, Jodell Cipro, PA-C)       Anesthesia Quick Evaluation

## 2020-04-20 ENCOUNTER — Other Ambulatory Visit (HOSPITAL_COMMUNITY)
Admission: RE | Admit: 2020-04-20 | Discharge: 2020-04-20 | Disposition: A | Discharge status: Home / Self Care | Payer: Medicare Other | Source: Ambulatory Visit | Attending: Orthopedic Surgery | Admitting: Orthopedic Surgery

## 2020-04-20 DIAGNOSIS — Z20822 Contact with and (suspected) exposure to covid-19: Secondary | ICD-10-CM | POA: Insufficient documentation

## 2020-04-20 DIAGNOSIS — Z01812 Encounter for preprocedural laboratory examination: Secondary | ICD-10-CM | POA: Insufficient documentation

## 2020-04-20 LAB — SARS CORONAVIRUS 2 (TAT 6-24 HRS): SARS Coronavirus 2: NEGATIVE

## 2020-04-23 MED ORDER — BUPIVACAINE LIPOSOME 1.3 % IJ SUSP
20.0000 mL | Freq: Once | INTRAMUSCULAR | Status: DC
  Filled 2020-04-23: qty 20

## 2020-04-24 ENCOUNTER — Encounter (HOSPITAL_COMMUNITY)
Admission: RE | Disposition: A | Discharge status: Home / Self Care | Payer: Self-pay | Source: Ambulatory Visit | Attending: Orthopedic Surgery

## 2020-04-24 ENCOUNTER — Ambulatory Visit (HOSPITAL_COMMUNITY): Payer: Medicare Other | Admitting: Certified Registered"

## 2020-04-24 ENCOUNTER — Ambulatory Visit (HOSPITAL_COMMUNITY)
Admission: RE | Admit: 2020-04-24 | Discharge: 2020-04-24 | Disposition: A | Discharge status: Home / Self Care | Payer: Medicare Other | Source: Ambulatory Visit | Attending: Orthopedic Surgery | Admitting: Orthopedic Surgery

## 2020-04-24 ENCOUNTER — Ambulatory Visit (HOSPITAL_COMMUNITY): Payer: Medicare Other | Admitting: Physician Assistant

## 2020-04-24 ENCOUNTER — Encounter (HOSPITAL_COMMUNITY): Payer: Self-pay | Admitting: Orthopedic Surgery

## 2020-04-24 DIAGNOSIS — Y831 Surgical operation with implant of artificial internal device as the cause of abnormal reaction of the patient, or of later complication, without mention of misadventure at the time of the procedure: Secondary | ICD-10-CM | POA: Insufficient documentation

## 2020-04-24 DIAGNOSIS — T84092A Other mechanical complication of internal right knee prosthesis, initial encounter: Secondary | ICD-10-CM | POA: Diagnosis not present

## 2020-04-24 DIAGNOSIS — M25561 Pain in right knee: Secondary | ICD-10-CM | POA: Insufficient documentation

## 2020-04-24 DIAGNOSIS — Z87891 Personal history of nicotine dependence: Secondary | ICD-10-CM | POA: Insufficient documentation

## 2020-04-24 HISTORY — PX: SYNOVECTOMY WITH POLY EXCHANGE: SHX6746

## 2020-04-24 LAB — CBC WITH DIFFERENTIAL/PLATELET
Abs Immature Granulocytes: 0.01 10*3/uL (ref 0.00–0.07)
Basophils Absolute: 0.1 10*3/uL (ref 0.0–0.1)
Basophils Relative: 1 %
Eosinophils Absolute: 0.2 10*3/uL (ref 0.0–0.5)
Eosinophils Relative: 4 %
HCT: 44.2 % (ref 39.0–52.0)
Hemoglobin: 14.8 g/dL (ref 13.0–17.0)
Immature Granulocytes: 0 %
Lymphocytes Relative: 27 %
Lymphs Abs: 1.4 10*3/uL (ref 0.7–4.0)
MCH: 30.5 pg (ref 26.0–34.0)
MCHC: 33.5 g/dL (ref 30.0–36.0)
MCV: 91.1 fL (ref 80.0–100.0)
Monocytes Absolute: 0.5 10*3/uL (ref 0.1–1.0)
Monocytes Relative: 9 %
Neutro Abs: 3.1 10*3/uL (ref 1.7–7.7)
Neutrophils Relative %: 59 %
Platelets: 134 10*3/uL — ABNORMAL LOW (ref 150–400)
RBC: 4.85 MIL/uL (ref 4.22–5.81)
RDW: 15.9 % — ABNORMAL HIGH (ref 11.5–15.5)
WBC: 5.3 10*3/uL (ref 4.0–10.5)
nRBC: 0 % (ref 0.0–0.2)

## 2020-04-24 LAB — COMPREHENSIVE METABOLIC PANEL
ALT: 19 U/L (ref 0–44)
AST: 17 U/L (ref 15–41)
Albumin: 4.2 g/dL (ref 3.5–5.0)
Alkaline Phosphatase: 67 U/L (ref 38–126)
Anion gap: 8 (ref 5–15)
BUN: 17 mg/dL (ref 8–23)
CO2: 27 mmol/L (ref 22–32)
Calcium: 9.3 mg/dL (ref 8.9–10.3)
Chloride: 104 mmol/L (ref 98–111)
Creatinine, Ser: 1.05 mg/dL (ref 0.61–1.24)
GFR, Estimated: >60 mL/min (ref >60)
Glucose, Bld: 116 mg/dL — ABNORMAL HIGH (ref 70–99)
Potassium: 4.9 mmol/L (ref 3.5–5.1)
Sodium: 139 mmol/L (ref 135–145)
Total Bilirubin: 0.7 mg/dL (ref 0.3–1.2)
Total Protein: 7.4 g/dL (ref 6.5–8.1)

## 2020-04-24 SURGERY — SYNOVECTOMY WITH POLY EXCHANGE
Anesthesia: Spinal | Laterality: Right

## 2020-04-24 MED ORDER — CEFAZOLIN SODIUM-DEXTROSE 2-4 GM/100ML-% IV SOLN
2.0000 g | INTRAVENOUS | Status: AC
  Administered 2020-04-24: 2 g via INTRAVENOUS
  Filled 2020-04-24: qty 100

## 2020-04-24 MED ORDER — TRANEXAMIC ACID-NACL 1000-0.7 MG/100ML-% IV SOLN
1000.0000 mg | Freq: Once | INTRAVENOUS | Status: AC
  Administered 2020-04-24: 1000 mg via INTRAVENOUS

## 2020-04-24 MED ORDER — DEXAMETHASONE SODIUM PHOSPHATE 10 MG/ML IJ SOLN: 10.0000 mg | Freq: Once | INTRAMUSCULAR | Status: DC

## 2020-04-24 MED ORDER — METHOCARBAMOL 500 MG IVPB - SIMPLE MED: 500.0000 mg | Freq: Four times a day (QID) | INTRAVENOUS | Status: DC | PRN

## 2020-04-24 MED ORDER — TRANEXAMIC ACID-NACL 1000-0.7 MG/100ML-% IV SOLN
INTRAVENOUS | Status: AC
  Filled 2020-04-24: qty 100

## 2020-04-24 MED ORDER — ONDANSETRON HCL 4 MG/2ML IJ SOLN
INTRAMUSCULAR | Status: AC
  Filled 2020-04-24: qty 2

## 2020-04-24 MED ORDER — ASPIRIN EC 325 MG PO TBEC: 325.0000 mg | DELAYED_RELEASE_TABLET | Freq: Two times a day (BID) | ORAL | Status: DC

## 2020-04-24 MED ORDER — ACETAMINOPHEN 500 MG PO TABS: 1000.0000 mg | ORAL_TABLET | Freq: Four times a day (QID) | ORAL | Status: DC

## 2020-04-24 MED ORDER — HYDROMORPHONE HCL 1 MG/ML IJ SOLN
INTRAMUSCULAR | Status: AC
  Filled 2020-04-24: qty 1

## 2020-04-24 MED ORDER — DOCUSATE SODIUM 100 MG PO CAPS: 100.0000 mg | ORAL_CAPSULE | Freq: Two times a day (BID) | ORAL | Status: DC

## 2020-04-24 MED ORDER — 0.9 % SODIUM CHLORIDE (POUR BTL) OPTIME
TOPICAL | Status: DC | PRN
  Administered 2020-04-24: 1000 mL

## 2020-04-24 MED ORDER — HYDROMORPHONE HCL 1 MG/ML IJ SOLN
0.5000 mg | INTRAMUSCULAR | Status: DC | PRN
  Administered 2020-04-24: 1 mg via INTRAVENOUS

## 2020-04-24 MED ORDER — PROPOFOL 10 MG/ML IV BOLUS
INTRAVENOUS | Status: AC
  Filled 2020-04-24: qty 20

## 2020-04-24 MED ORDER — ASPIRIN EC 325 MG PO TBEC: 325.0000 mg | DELAYED_RELEASE_TABLET | Freq: Two times a day (BID) | ORAL | 0 refills | Status: AC

## 2020-04-24 MED ORDER — BISACODYL 5 MG PO TBEC: 5.0000 mg | DELAYED_RELEASE_TABLET | Freq: Every day | ORAL | Status: DC | PRN

## 2020-04-24 MED ORDER — ACETAMINOPHEN 500 MG PO TABS
1000.0000 mg | ORAL_TABLET | Freq: Once | ORAL | Status: AC
  Administered 2020-04-24: 1000 mg via ORAL
  Filled 2020-04-24: qty 2

## 2020-04-24 MED ORDER — DEXAMETHASONE SODIUM PHOSPHATE 10 MG/ML IJ SOLN
INTRAMUSCULAR | Status: AC
  Filled 2020-04-24: qty 1

## 2020-04-24 MED ORDER — BUPIVACAINE-EPINEPHRINE (PF) 0.25% -1:200000 IJ SOLN
INTRAMUSCULAR | Status: AC
  Filled 2020-04-24: qty 60

## 2020-04-24 MED ORDER — ONDANSETRON HCL 4 MG PO TABS
4.0000 mg | ORAL_TABLET | Freq: Four times a day (QID) | ORAL | Status: DC | PRN
  Filled 2020-04-24: qty 1

## 2020-04-24 MED ORDER — ONDANSETRON HCL 4 MG/2ML IJ SOLN
INTRAMUSCULAR | Status: DC | PRN
  Administered 2020-04-24: 4 mg via INTRAVENOUS

## 2020-04-24 MED ORDER — SODIUM CHLORIDE 0.9 % IR SOLN
Status: DC | PRN
  Administered 2020-04-24: 3000 mL

## 2020-04-24 MED ORDER — OXYCODONE HCL 5 MG PO TABS: 5.0000 mg | ORAL_TABLET | Freq: Once | ORAL | Status: DC | PRN

## 2020-04-24 MED ORDER — MEPIVACAINE HCL (PF) 2 % IJ SOLN
INTRAMUSCULAR | Status: DC | PRN
  Administered 2020-04-24: 3.4 mL via INTRATHECAL

## 2020-04-24 MED ORDER — FENTANYL CITRATE (PF) 100 MCG/2ML IJ SOLN
50.0000 ug | Freq: Once | INTRAMUSCULAR | Status: AC
  Administered 2020-04-24: 50 ug via INTRAVENOUS
  Filled 2020-04-24: qty 2

## 2020-04-24 MED ORDER — CEFAZOLIN SODIUM-DEXTROSE 2-4 GM/100ML-% IV SOLN: 2.0000 g | Freq: Four times a day (QID) | INTRAVENOUS | Status: DC

## 2020-04-24 MED ORDER — BUPIVACAINE-MELOXICAM ER 400-12 MG/14ML IJ SOLN
INTRAMUSCULAR | Status: AC
  Filled 2020-04-24: qty 1

## 2020-04-24 MED ORDER — LACTATED RINGERS IV SOLN: INTRAVENOUS | Status: DC

## 2020-04-24 MED ORDER — PHENOL 1.4 % MT LIQD: 1.0000 | OROMUCOSAL | Status: DC | PRN

## 2020-04-24 MED ORDER — PROPOFOL 500 MG/50ML IV EMUL
INTRAVENOUS | Status: DC | PRN
  Administered 2020-04-24: 135 ug/kg/min via INTRAVENOUS

## 2020-04-24 MED ORDER — DIPHENHYDRAMINE HCL 12.5 MG/5ML PO ELIX: 12.5000 mg | ORAL_SOLUTION | ORAL | Status: DC | PRN

## 2020-04-24 MED ORDER — OXYCODONE HCL 5 MG PO TABS: 5.0000 mg | ORAL_TABLET | Freq: Four times a day (QID) | ORAL | 0 refills | Status: AC | PRN

## 2020-04-24 MED ORDER — HYDROMORPHONE HCL 1 MG/ML IJ SOLN: 0.2500 mg | INTRAMUSCULAR | Status: DC | PRN

## 2020-04-24 MED ORDER — OXYCODONE HCL 5 MG PO TABS
ORAL_TABLET | ORAL | Status: AC
  Filled 2020-04-24: qty 2

## 2020-04-24 MED ORDER — PROPOFOL 1000 MG/100ML IV EMUL
INTRAVENOUS | Status: AC
  Filled 2020-04-24: qty 100

## 2020-04-24 MED ORDER — STERILE WATER FOR IRRIGATION IR SOLN
Status: DC | PRN
  Administered 2020-04-24: 2000 mL

## 2020-04-24 MED ORDER — METOCLOPRAMIDE HCL 5 MG/ML IJ SOLN: 5.0000 mg | Freq: Three times a day (TID) | INTRAMUSCULAR | Status: DC | PRN

## 2020-04-24 MED ORDER — ALUM & MAG HYDROXIDE-SIMETH 200-200-20 MG/5ML PO SUSP: 30.0000 mL | ORAL | Status: DC | PRN

## 2020-04-24 MED ORDER — PROPOFOL 500 MG/50ML IV EMUL
INTRAVENOUS | Status: AC
  Filled 2020-04-24: qty 50

## 2020-04-24 MED ORDER — CELECOXIB 200 MG PO CAPS
400.0000 mg | ORAL_CAPSULE | Freq: Once | ORAL | Status: AC
  Administered 2020-04-24: 400 mg via ORAL
  Filled 2020-04-24: qty 2

## 2020-04-24 MED ORDER — METHOCARBAMOL 500 MG PO TABS
500.0000 mg | ORAL_TABLET | Freq: Four times a day (QID) | ORAL | Status: DC | PRN
  Administered 2020-04-24: 500 mg via ORAL

## 2020-04-24 MED ORDER — BUPIVACAINE-MELOXICAM ER 200-6 MG/7ML IJ SOLN
INTRAMUSCULAR | Status: DC | PRN
  Administered 2020-04-24: 400 mg

## 2020-04-24 MED ORDER — GABAPENTIN 300 MG PO CAPS
300.0000 mg | ORAL_CAPSULE | Freq: Once | ORAL | Status: AC
  Administered 2020-04-24: 300 mg via ORAL
  Filled 2020-04-24: qty 1

## 2020-04-24 MED ORDER — LACTATED RINGERS IV BOLUS
500.0000 mL | Freq: Once | INTRAVENOUS | Status: AC
  Administered 2020-04-24: 500 mL via INTRAVENOUS

## 2020-04-24 MED ORDER — SODIUM CHLORIDE 0.9 % IV SOLN: INTRAVENOUS | Status: DC

## 2020-04-24 MED ORDER — OXYCODONE HCL 5 MG PO TABS
5.0000 mg | ORAL_TABLET | ORAL | Status: DC | PRN
  Administered 2020-04-24: 10 mg via ORAL

## 2020-04-24 MED ORDER — DEXAMETHASONE SODIUM PHOSPHATE 10 MG/ML IJ SOLN
8.0000 mg | Freq: Once | INTRAMUSCULAR | Status: AC
  Administered 2020-04-24: 8 mg via INTRAVENOUS

## 2020-04-24 MED ORDER — ORAL CARE MOUTH RINSE
15.0000 mL | Freq: Once | OROMUCOSAL | Status: AC
  Administered 2020-04-24: 15 mL via OROMUCOSAL

## 2020-04-24 MED ORDER — METHOCARBAMOL 500 MG PO TABS: 500.0000 mg | ORAL_TABLET | Freq: Four times a day (QID) | ORAL | 0 refills | Status: AC

## 2020-04-24 MED ORDER — MIDAZOLAM HCL 2 MG/2ML IJ SOLN
1.0000 mg | Freq: Once | INTRAMUSCULAR | Status: AC
  Administered 2020-04-24: 2 mg via INTRAVENOUS
  Filled 2020-04-24: qty 2

## 2020-04-24 MED ORDER — OXYCODONE HCL 5 MG/5ML PO SOLN: 5.0000 mg | Freq: Once | ORAL | Status: DC | PRN

## 2020-04-24 MED ORDER — POVIDONE-IODINE 10 % EX SWAB
2.0000 "application " | Freq: Once | CUTANEOUS | Status: AC
  Administered 2020-04-24: 2 via TOPICAL

## 2020-04-24 MED ORDER — CHLORHEXIDINE GLUCONATE 0.12 % MT SOLN: 15.0000 mL | Freq: Once | OROMUCOSAL | Status: AC

## 2020-04-24 MED ORDER — FERROUS SULFATE 325 (65 FE) MG PO TABS: 325.0000 mg | ORAL_TABLET | Freq: Three times a day (TID) | ORAL | Status: DC

## 2020-04-24 MED ORDER — LISINOPRIL 10 MG PO TABS: 10.0000 mg | ORAL_TABLET | Freq: Every day | ORAL | Status: DC

## 2020-04-24 MED ORDER — METHOCARBAMOL 500 MG PO TABS
ORAL_TABLET | ORAL | Status: AC
  Filled 2020-04-24: qty 1

## 2020-04-24 MED ORDER — ZOLPIDEM TARTRATE 5 MG PO TABS
5.0000 mg | ORAL_TABLET | Freq: Every evening | ORAL | Status: DC | PRN
Start: 2020-04-24 — End: 2020-04-24

## 2020-04-24 MED ORDER — TRANEXAMIC ACID-NACL 1000-0.7 MG/100ML-% IV SOLN
1000.0000 mg | INTRAVENOUS | Status: AC
  Administered 2020-04-24: 1000 mg via INTRAVENOUS
  Filled 2020-04-24: qty 100

## 2020-04-24 MED ORDER — ONDANSETRON HCL 4 MG/2ML IJ SOLN: 4.0000 mg | Freq: Four times a day (QID) | INTRAMUSCULAR | Status: DC | PRN

## 2020-04-24 MED ORDER — MENTHOL 3 MG MT LOZG: 1.0000 | LOZENGE | OROMUCOSAL | Status: DC | PRN

## 2020-04-24 MED ORDER — PROPOFOL 10 MG/ML IV BOLUS
INTRAVENOUS | Status: DC | PRN
Start: 2020-04-24 — End: 2020-04-24
  Administered 2020-04-24 (×2): 20 mg via INTRAVENOUS

## 2020-04-24 MED ORDER — PROMETHAZINE HCL 25 MG/ML IJ SOLN: 6.2500 mg | INTRAMUSCULAR | Status: DC | PRN

## 2020-04-24 MED ORDER — FLEET ENEMA 7-19 GM/118ML RE ENEM
1.0000 | ENEMA | Freq: Once | RECTAL | Status: DC | PRN
Start: 2020-04-24 — End: 2020-04-24

## 2020-04-24 MED ORDER — LACTATED RINGERS IV BOLUS
250.0000 mL | Freq: Once | INTRAVENOUS | Status: AC
  Administered 2020-04-24: 250 mL via INTRAVENOUS

## 2020-04-24 MED ORDER — SODIUM CHLORIDE (PF) 0.9 % IJ SOLN
INTRAMUSCULAR | Status: AC
  Filled 2020-04-24: qty 20

## 2020-04-24 MED ORDER — BUPIVACAINE HCL (PF) 0.5 % IJ SOLN
INTRAMUSCULAR | Status: DC | PRN
  Administered 2020-04-24: 20 mL via PERINEURAL

## 2020-04-24 MED ORDER — GABAPENTIN 300 MG PO CAPS: 300.0000 mg | ORAL_CAPSULE | Freq: Three times a day (TID) | ORAL | Status: DC

## 2020-04-24 MED ORDER — SENNOSIDES-DOCUSATE SODIUM 8.6-50 MG PO TABS: 1.0000 | ORAL_TABLET | Freq: Every evening | ORAL | Status: DC | PRN

## 2020-04-24 MED ORDER — METOCLOPRAMIDE HCL 5 MG PO TABS
5.0000 mg | ORAL_TABLET | Freq: Three times a day (TID) | ORAL | Status: DC | PRN
  Filled 2020-04-24: qty 2

## 2020-04-24 SURGICAL SUPPLY — 40 items
BAG ZIPLOCK 12X15 (MISCELLANEOUS) ×2 IMPLANT
BNDG ELASTIC 6X5.8 VLCR STR LF (GAUZE/BANDAGES/DRESSINGS) ×2 IMPLANT
COVER SURGICAL LIGHT HANDLE (MISCELLANEOUS) ×2 IMPLANT
COVER WAND RF STERILE (DRAPES) IMPLANT
CUFF TOURN SGL QUICK 34 (TOURNIQUET CUFF) ×2
CUFF TRNQT CYL 34X4.125X (TOURNIQUET CUFF) ×2 IMPLANT
DECANTER SPIKE VIAL GLASS SM (MISCELLANEOUS) ×4 IMPLANT
DRAPE INCISE IOBAN 66X45 STRL (DRAPES) ×4 IMPLANT
DRAPE SHEET LG 3/4 BI-LAMINATE (DRAPES) ×2 IMPLANT
DRAPE U-SHAPE 47X51 STRL (DRAPES) ×2 IMPLANT
DRSG AQUACEL AG ADV 3.5X10 (GAUZE/BANDAGES/DRESSINGS) ×2 IMPLANT
DURAPREP 26ML APPLICATOR (WOUND CARE) ×4 IMPLANT
ELECT REM PT RETURN 15FT ADLT (MISCELLANEOUS) ×2 IMPLANT
GLOVE BIOGEL PI IND STRL 8.5 (GLOVE) ×2 IMPLANT
GLOVE BIOGEL PI INDICATOR 8.5 (GLOVE) ×2
GLOVE SURG ORTHO 8.0 STRL STRW (GLOVE) ×6 IMPLANT
GOWN STRL REUS W/TWL XL LVL3 (GOWN DISPOSABLE) ×4 IMPLANT
HANDPIECE INTERPULSE COAX TIP (DISPOSABLE) ×1
HOOD PEEL AWAY FLYTE STAYCOOL (MISCELLANEOUS) ×6 IMPLANT
INSERT TIBIAL BRG PS SZ6 10 (Miscellaneous) ×2 IMPLANT
KIT TURNOVER KIT A (KITS) IMPLANT
MANIFOLD NEPTUNE II (INSTRUMENTS) ×2 IMPLANT
NEEDLE HYPO 22GX1.5 SAFETY (NEEDLE) ×2 IMPLANT
NS IRRIG 1000ML POUR BTL (IV SOLUTION) ×2 IMPLANT
PACK TOTAL KNEE CUSTOM (KITS) ×2 IMPLANT
PENCIL SMOKE EVACUATOR (MISCELLANEOUS) IMPLANT
PROTECTOR NERVE ULNAR (MISCELLANEOUS) ×2 IMPLANT
SET HNDPC FAN SPRY TIP SCT (DISPOSABLE) ×1 IMPLANT
STRIP CLOSURE SKIN 1/2X4 (GAUZE/BANDAGES/DRESSINGS) ×2 IMPLANT
SUT MNCRL AB 3-0 PS2 18 (SUTURE) ×2 IMPLANT
SUT STRATAFIX 0 PDS 27 VIOLET (SUTURE) ×2
SUT STRATAFIX PDS+ 0 24IN (SUTURE) ×2 IMPLANT
SUT VIC AB 1 CT1 36 (SUTURE) ×2 IMPLANT
SUTURE STRATFX 0 PDS 27 VIOLET (SUTURE) ×1 IMPLANT
SWAB COLLECTION DEVICE MRSA (MISCELLANEOUS) IMPLANT
SWAB CULTURE ESWAB REG 1ML (MISCELLANEOUS) IMPLANT
SYR CONTROL 10ML LL (SYRINGE) ×4 IMPLANT
TRAY FOLEY MTR SLVR 16FR STAT (SET/KITS/TRAYS/PACK) ×2 IMPLANT
WATER STERILE IRR 1000ML POUR (IV SOLUTION) ×2 IMPLANT
WRAP KNEE MAXI GEL POST OP (GAUZE/BANDAGES/DRESSINGS) ×2 IMPLANT

## 2020-04-24 NOTE — Transfer of Care (Signed)
Immediate Anesthesia Transfer of Care Note  Patient: Jeffrey Brady  Procedure(s) Performed: SYNOVECTOMY WITH POLY EXCHANGE (Right )  Patient Location: PACU  Anesthesia Type:Spinal  Level of Consciousness: awake, alert  and oriented  Airway & Oxygen Therapy: Patient Spontanous Breathing and Patient connected to face mask oxygen  Post-op Assessment: Report given to RN and Post -op Vital signs reviewed and stable  Post vital signs: Reviewed and stable  Last Vitals:  Vitals Value Taken Time  BP    Temp    Pulse    Resp    SpO2      Last Pain:  Vitals:   04/24/20 0648  TempSrc: Oral         Complications: No complications documented.

## 2020-04-24 NOTE — Progress Notes (Signed)
Orthopedic Tech Progress Note Patient Details:  Jeffrey Brady 09/27/1955 [id number]  CPM Right Knee CPM Right Knee: On Additional Comments: foot roll  Post Interventions Patient Tolerated: Well Instructions Provided: Care of device  Saul Fordyce 04/24/2020, 10:02 AM

## 2020-04-24 NOTE — Progress Notes (Signed)
Orthopedic Tech Progress Note Patient Details:  Jeffrey Brady July 21, 1955 [id number]  CPM Right Knee CPM Right Knee: Off Additional Comments: foot roll  Post Interventions Patient Tolerated: Well Instructions Provided: Care of device  Saul Fordyce 04/24/2020, 12:05 PM

## 2020-04-24 NOTE — H&P (Signed)
Jeffrey Brady MRN:  [id number] DOB/SEX:  1955/04/17/male  CHIEF COMPLAINT:  Painful right Knee  HISTORY: Patient is a 65 y.o. male presented with a history of pain in the right knee. Onset of symptoms was gradual starting a few years ago with unchanged course since that time. Patient has been treated conservatively with over-the-counter NSAIDs and activity modification. Patient currently rates pain in the knee at 10 out of 10 with activity. There is pain at night.  PAST MEDICAL HISTORY: Patient Active Problem List   Diagnosis Date Noted  . Status post total knee replacement, right 01/20/2018  . Unilateral primary osteoarthritis, right knee 12/29/2017   Past Medical History:  Diagnosis Date  . Arthritis   . History of kidney stones   . Hypertension   . Pre-diabetes   . Sleep apnea    sleep study 30 years ago in [place] cpap    Past Surgical History:  Procedure Laterality Date  . ELBOW SURGERY Right   . MENISCUS REPAIR    . NASAL SEPTUM SURGERY    . ROTATOR CUFF REPAIR Right   . TOTAL KNEE ARTHROPLASTY Right 01/20/2018   Procedure: RIGHT TOTAL KNEE ARTHROPLASTY;  Surgeon: Kathryne Hitch, MD;  [facility];  Service: Orthopedics;  Laterality: Right;     MEDICATIONS:   No medications prior to admission.    ALLERGIES:   Allergies  Allergen Reactions  . Nexium [Esomeprazole Magnesium] Nausea And Vomiting and Other (See Comments)    Dehydrated    REVIEW OF SYSTEMS:  A comprehensive review of systems was negative except for: Musculoskeletal: positive for arthralgias and bone pain   FAMILY HISTORY:  No family history on file.  SOCIAL HISTORY:   Social History   Tobacco Use  . Smoking status: Former Smoker    Types: Cigarettes  . Smokeless tobacco: Never Used  Substance Use Topics  . Alcohol use: Yes    Comment: occasional     EXAMINATION:  Vital signs in last 24 hours:    There were no vitals taken for this visit.  General Appearance:    Alert,  cooperative, no distress, appears stated age  Head:    Normocephalic, without obvious abnormality, atraumatic  Eyes:    PERRL, conjunctiva/corneas clear, EOM's intact, fundi    benign, both eyes       Ears:    Normal TM's and external ear canals, both ears  Nose:   Nares normal, septum midline, mucosa normal, no drainage    or sinus tenderness  Throat:   Lips, mucosa, and tongue normal; teeth and gums normal  Neck:   Supple, symmetrical, trachea midline, no adenopathy;       thyroid:  No enlargement/tenderness/nodules; no carotid   bruit or JVD  Back:     Symmetric, no curvature, ROM normal, no CVA tenderness  Lungs:     Clear to auscultation bilaterally, respirations unlabored  Chest wall:    No tenderness or deformity  Heart:    Regular rate and rhythm, S1 and S2 normal, no murmur, rub   or gallop  Abdomen:     Soft, non-tender, bowel sounds active all four quadrants,    no masses, no organomegaly  Genitalia:    Normal male without lesion, discharge or tenderness  Rectal:    Normal tone, normal prostate, no masses or tenderness;   guaiac negative stool  Extremities:   Extremities normal, atraumatic, no cyanosis or edema  Pulses:   2+ and symmetric all extremities  Skin:  Skin color, texture, turgor normal, no rashes or lesions  Lymph nodes:   Cervical, supraclavicular, and axillary nodes normal  Neurologic:   CNII-XII intact. Normal strength, sensation and reflexes      throughout    Musculoskeletal:  ROM 0-80, Ligaments intact,  Imaging Review Plain radiographs demonstrate s/p tka of the right knee. The overall alignment is neutral. The bone quality appears to be good for age and reported activity level.  Assessment/Plan: Failed tka right knee  The patient history, physical examination and imaging studies are consistent with failed tka of the right knee. The patient has failed conservative treatment.  The clearance notes were reviewed.  After discussion with the patient it was  felt that open synovectomy with poly exchange was indicated. The procedure,  risks, and benefits of open synovectomy with poly exhange were presented and reviewed. The risks including but not limited to aseptic loosening, infection, blood clots, vascular injury, stiffness, patella tracking problems complications among others were discussed. The patient acknowledged the explanation, agreed to proceed with the plan.  Preoperative templating of the joint replacement has been completed, documented, and submitted to the Operating Room personnel in order to optimize intra-operative equipment management.    Patient's anticipated LOS is less than 2 midnights, meeting these requirements: - Younger than 18 - Lives within 1 hour of care - Has a competent adult at home to recover with post-op recover - NO history of  - Chronic pain requiring opiods  - Diabetes  - Coronary Artery Disease  - Heart failure  - Heart attack  - Stroke  - DVT/VTE  - Cardiac arrhythmia  - Respiratory Failure/COPD  - Renal failure  - Anemia  - Advanced Liver disease       Guy Sandifer 04/24/2020, 6:02 AM

## 2020-04-24 NOTE — Anesthesia Procedure Notes (Signed)
Spinal  Patient location during procedure: OR Start time: 04/24/2020 8:25 AM End time: 04/24/2020 8:29 AM Staffing Performed: resident/CRNA  Resident/CRNA: Nelle Don, CRNA Preanesthetic Checklist Completed: patient identified, IV checked, risks and benefits discussed, surgical consent, monitors and equipment checked and timeout performed Spinal Block Patient position: sitting Prep: DuraPrep Patient monitoring: heart rate, continuous pulse ox and blood pressure Approach: midline Location: L3-4 Injection technique: single-shot Needle Needle type: Pencan  Needle gauge: 24 G Needle length: 9 cm

## 2020-04-24 NOTE — Anesthesia Procedure Notes (Signed)
Anesthesia Regional Block: Adductor canal block   Pre-Anesthetic Checklist: ,, timeout performed, Correct Patient, Correct Site, Correct Laterality, Correct Procedure, Correct Position, site marked, Risks and benefits discussed,  Surgical consent,  Pre-op evaluation,  At surgeon's request and post-op pain management  Laterality: Right  Prep: chloraprep       Needles:  Injection technique: Single-shot  Needle Type: Echogenic Needle     Needle Length: 9cm      Additional Needles:   Procedures:,,,, ultrasound used (permanent image in chart),,,,  Narrative:  Start time: 04/24/2020 7:50 AM End time: 04/24/2020 7:55 AM Injection made incrementally with aspirations every 5 mL.  Performed by: Personally  Anesthesiologist: Eilene Ghazi, MD  Additional Notes: Patient tolerated the procedure well without complications

## 2020-04-24 NOTE — Anesthesia Procedure Notes (Signed)
Procedure Name: MAC Date/Time: 04/24/2020 8:23 AM Performed by: Niel Hummer, CRNA Pre-anesthesia Checklist: Emergency Drugs available, Patient identified, Suction available and Patient being monitored Oxygen Delivery Method: Simple face mask

## 2020-04-24 NOTE — Anesthesia Procedure Notes (Signed)
Anesthesia Procedure Image    

## 2020-04-24 NOTE — Evaluation (Signed)
Physical Therapy Evaluation Patient Details Name: Jeffrey Brady MRN: [id number] DOB: Feb 03, 1956 Today's Date: 04/24/2020   History of Present Illness  Patient is a 65 y.o. male s/p rT TKR on 04/24/2020 with PMH significant for R TKA (2019), HTN, arthritis, pre-diabetes, R rotator cuff repair, meniscus repair, and R elbow surgery.  Clinical Impression  Pt is s/p R TKR POD 0. Pt reports that he was independent with mobility at baseline. Pt was unable to complete SLR, hip ABD, and required MIN assist with SAQ 2/2 pain. Pt required MIN guard and verbal cues for sit to stand transfers. Pt required MIN assist for walker stability with stair negotiation. Pt required MIN guard for ambulation 120'ft with verbal cues for RW management and step to gait pattern. PT reviewed WBAT status with pt and HEP handout. Pt's wife and sons will be available to help at home. Recommend home with family support. Pt will benefit from skilled PT to increase independence and safety with mobility.       Follow Up Recommendations Outpatient PT;Follow surgeon's recommendation for DC plan and follow-up therapies    Equipment Recommendations  None recommended by PT    Recommendations for Other Services       Precautions / Restrictions Precautions Precautions: Fall Restrictions Weight Bearing Restrictions: No Other Position/Activity Restrictions: WBAT      Mobility  Bed Mobility Overal bed mobility: Needs Assistance Bed Mobility: Supine to Sit;Sit to Supine     Supine to sit: Supervision Sit to supine: Supervision   General bed mobility comments: Pt required verbal cues for supine to sit transfer with extra time. Pt required verbal cues and utilized gait belt sling to assist R LE into bed for sit to supine transfer.    Transfers Overall transfer level: Needs assistance Equipment used: Rolling walker (2 wheeled) Transfers: Sit to/from Stand Sit to Stand: Min guard         General transfer comment:  Pt required MIN guard with verbal cues for hand placement. PT provided manual block at R knee to facilitate R knee ext in WB for safety. Pt displayed knee buckling x1 2/2 pain in standing after that pt able to effictively use UEs to prevent further knee buckling.  Ambulation/Gait Ambulation/Gait assistance: Min guard Gait Distance (Feet): 120 Feet Assistive device: Rolling walker (2 wheeled) Gait Pattern/deviations: Step-to pattern;Decreased stride length;Decreased weight shift to right Gait velocity: decreased   General Gait Details: pt required MIN guard with verbal cues for RW management and step to gait pattern. Pt maintained safe position to walker throughout gait with no overt LOB.  Stairs Stairs: Yes Stairs assistance: Min assist Stair Management: With walker Number of Stairs: 10 General stair comments: Pt required MIN assist for walker stability and verbal cues for technique. Pt was able to verbalize stair negotiation technique when prompted during second trial. PT verbalized safe guarding position for family to provide.  Wheelchair Mobility    Modified Rankin (Stroke Patients Only)       Balance Overall balance assessment: Needs assistance Sitting-balance support: No upper extremity supported Sitting balance-Leahy Scale: Good     Standing balance support: Bilateral upper extremity supported Standing balance-Leahy Scale: Poor Standing balance comment: Pt required use of B UEs on RW                             Pertinent Vitals/Pain Pain Assessment: 0-10 Pain Score: 3  Pain Location: R Knee Pain Descriptors / Indicators:  Sore;Discomfort Pain Intervention(s): Limited activity within patient's tolerance;Monitored during session;Repositioned;Premedicated before session    Home Living Family/patient expects to be discharged to:: Private residence Living Arrangements: Spouse/significant other Available Help at Discharge: Family Type of Home: House Home  Access: Stairs to enter Entrance Stairs-Rails: None Secretary/administrator of Steps: 3 Home Layout: One level Home Equipment: Bedside commode;Walker - 2 wheels Additional Comments: Pt will have assistance from wife at home and his sons who live nearby    Prior Function Level of Independence: Independent               Hand Dominance   Dominant Hand: Right    Extremity/Trunk Assessment   Upper Extremity Assessment Upper Extremity Assessment: Overall WFL for tasks assessed    Lower Extremity Assessment Lower Extremity Assessment: RLE deficits/detail RLE Deficits / Details: Unable to perform full SLR ( at least 6") 2/2 pain RLE: Unable to fully assess due to pain RLE Sensation: WNL    Cervical / Trunk Assessment Cervical / Trunk Assessment: Normal  Communication   Communication: No difficulties  Cognition Arousal/Alertness: Awake/alert Behavior During Therapy: WFL for tasks assessed/performed Overall Cognitive Status: Within Functional Limits for tasks assessed                                        General Comments      Exercises Total Joint Exercises Ankle Circles/Pumps: AROM;Both;20 reps;Supine Quad Sets: AROM;Right;Supine;5 reps Short Arc Quad: AROM;AAROM;Right;5 reps Heel Slides:  (pt unable to complete 2/2 pain) Hip ABduction/ADduction:  (unable to complete 2/2 pain) Straight Leg Raises:  (pt limited 2/2 pain)   Assessment/Plan    PT Assessment Patient needs continued PT services  PT Problem List Decreased strength;Decreased range of motion;Decreased activity tolerance;Decreased balance;Decreased mobility;Decreased knowledge of use of DME;Pain       PT Treatment Interventions DME instruction;Gait training;Stair training;Functional mobility training;Therapeutic activities;Therapeutic exercise;Balance training;Neuromuscular re-education;Patient/family education    PT Goals (Current goals can be found in the Care Plan section)  Acute  Rehab PT Goals Patient Stated Goal: return home PT Goal Formulation: With patient Time For Goal Achievement: 05/01/20 Potential to Achieve Goals: Good    Frequency 7X/week   Barriers to discharge        Co-evaluation               AM-PAC PT "6 Clicks" Mobility  Outcome Measure Help needed turning from your back to your side while in a flat bed without using bedrails?: A Little Help needed moving from lying on your back to sitting on the side of a flat bed without using bedrails?: A Little Help needed moving to and from a bed to a chair (including a wheelchair)?: A Little Help needed standing up from a chair using your arms (e.g., wheelchair or bedside chair)?: A Little Help needed to walk in hospital room?: A Little Help needed climbing 3-5 steps with a railing? : A Little 6 Click Score: 18    End of Session Equipment Utilized During Treatment: Gait belt Activity Tolerance: Patient tolerated treatment well;Patient limited by pain Patient left: in bed;with call bell/phone within reach Nurse Communication: Mobility status PT Visit Diagnosis: Unsteadiness on feet (R26.81);Muscle weakness (generalized) (M62.81);Pain Pain - Right/Left: Right Pain - part of body: Knee    Time: 1315-1346 PT Time Calculation (min) (ACUTE ONLY): 31 min   Charges:  Loyal Gambler, SPT  Acute rehab    Loyal Gambler 04/24/2020, 3:39 PM

## 2020-04-24 NOTE — Op Note (Signed)
TOTAL KNEE REPLACEMENT OPERATIVE NOTE:  04/24/2020  1:17 PM  PATIENT:  Jeffrey Brady  65 y.o. 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  Informed consent was obtained prior to anesthesia. The risks versus benefits of the operation were explain and in a way the patient can, and did, understand.    Description of procedure:     The patient was taken to the operating room and placed under anesthesia.  The patient was positioned in the usual fashion taking care that all body parts were adequately padded and/or protected.  A tourniquet was applied and the leg prepped and draped in the usual sterile fashion.  The extremity was exsanguinated with the esmarch and tourniquet inflated to 300 mmHg.  Pre-operative range of motion was 0-80., extremely tight with gap balance in extension.    A midline incision approximately 6-7 inches long was made with a #10 blade.  A new blade was used to make a parapatellar arthrotomy going 2-3 cm into the quadriceps tendon, over the patella, and alongside the medial aspect of the patellar tendon.  A synovectomy was then performed with the #10 blade and forceps. I then elevated the deep MCL off the medial tibial metaphysis subperiosteally around to the semimembranosus attachment.    I everted the patella and continued with a 10 blade removing mature scar  tissue.  The components were observed and were well fixed with no issues. The polyethelene was removed with a homan retractor and the posterior capsule was debrided with a small rongeur.    I then trialed with the original size 75m poly which I felt was still too tight even after medial releasing so I downsized to a 56mm [employer] Polyethelene.  I had excellent flexion/extension gap balance, excellent patella tracking.  Flexion was full and beyond 120 degrees; extension was zero.  These components were chosen and the staff opened them to me on the back table while the knee was lavaged copiously and the cement mixed.  The soft tissue was infiltrated with 60cc of exparel 1.3% through a 21 gauge needle.  The capsule was infilltrated with a 60cc exparel/marcaine/saline mixture.   Once the cement was hard, the tourniquet was let down.  Hemostasis was obtained.  The arthrotomy was closed using a #1 stratofix running suture.  The deep soft tissues were closed with #0 vicryls and the subcuticular layer closed with #2-0 vicryl.  The skin was reapproximated and closed with 3.0 Monocryl.  The wound was covered with steristrips, aquacel dressing, and a TED stocking.   The patient was then awakened, extubated, and taken to the recovery room in stable condition.  BLOOD LOSS:  300cc COMPLICATIONS:  None.  PLAN OF CARE: Discharge to home after PACU  PATIENT DISPOSITION:  PACU - hemodynamically stable.   Please fax a copy of this op note to my office at [phone] (please only include page 1 and 2 of the Case Information  op note)

## 2020-04-24 NOTE — Progress Notes (Signed)
Assisted Dr. Rose  with  Right Knee Adductor Canal block. Side rails up, monitors on throughout procedure. See vital signs in flow sheet. Tolerated Procedure well.  

## 2020-04-25 NOTE — Anesthesia Postprocedure Evaluation (Signed)
Anesthesia Post Note  Patient: Jeffrey Brady  Procedure(s) Performed: SYNOVECTOMY WITH POLY EXCHANGE (Right )     Patient location during evaluation: PACU Anesthesia Type: Spinal Level of consciousness: oriented and awake and alert Pain management: pain level controlled Vital Signs Assessment: post-procedure vital signs reviewed and stable Respiratory status: spontaneous breathing, respiratory function stable and patient connected to nasal cannula oxygen Cardiovascular status: blood pressure returned to baseline and stable Postop Assessment: no headache, no backache and no apparent nausea or vomiting Anesthetic complications: no   No complications documented.  Last Vitals:  Vitals:   04/24/20 1130 04/24/20 1157  BP: (!) 141/81 (!) 151/87  Pulse: 61 62  Resp: 14 14  Temp: 36.9 C 36.7 C  SpO2: 98% 97%    Last Pain:  Vitals:   04/24/20 1230  TempSrc:   PainSc: 7                  Darick Fetters S

## 2020-04-26 ENCOUNTER — Encounter (HOSPITAL_COMMUNITY): Payer: Self-pay | Admitting: Orthopedic Surgery

## 2020-05-05 ENCOUNTER — Encounter (HOSPITAL_COMMUNITY): Payer: Self-pay | Admitting: Orthopedic Surgery
